# Patient Record
Sex: Female | Born: 1995 | Race: White | Hispanic: No | Marital: Single | Smoking: Never smoker
Health system: Southern US, Community
[De-identification: ages and names within clinical notes are randomized; demographics above are authoritative.]

## PROBLEM LIST (undated history)

## (undated) DIAGNOSIS — Z8619 Personal history of other infectious and parasitic diseases: Secondary | ICD-10-CM

## (undated) DIAGNOSIS — E039 Hypothyroidism, unspecified: Secondary | ICD-10-CM

## (undated) DIAGNOSIS — F419 Anxiety disorder, unspecified: Secondary | ICD-10-CM

## (undated) DIAGNOSIS — F909 Attention-deficit hyperactivity disorder, unspecified type: Secondary | ICD-10-CM

## (undated) DIAGNOSIS — E038 Other specified hypothyroidism: Secondary | ICD-10-CM

## (undated) DIAGNOSIS — D649 Anemia, unspecified: Secondary | ICD-10-CM

## (undated) HISTORY — DX: Other specified hypothyroidism: E03.8

## (undated) HISTORY — DX: Attention-deficit hyperactivity disorder, unspecified type: F90.9

## (undated) HISTORY — PX: NO PAST SURGERIES: SHX2092

## (undated) HISTORY — DX: Personal history of other infectious and parasitic diseases: Z86.19

---
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
## 2016-01-07 ENCOUNTER — Ambulatory Visit (HOSPITAL_COMMUNITY)
Admission: RE | Admit: 2016-01-07 | Discharge: 2016-01-07 | Disposition: A | Discharge status: Home / Self Care | Payer: BLUE CROSS/BLUE SHIELD | Source: Ambulatory Visit | Attending: Obstetrics and Gynecology | Admitting: Obstetrics and Gynecology

## 2016-01-07 ENCOUNTER — Other Ambulatory Visit (HOSPITAL_COMMUNITY): Payer: Self-pay | Admitting: Maternal and Fetal Medicine

## 2016-01-07 ENCOUNTER — Encounter (HOSPITAL_COMMUNITY): Payer: Self-pay

## 2016-01-07 DIAGNOSIS — Z3A33 33 weeks gestation of pregnancy: Secondary | ICD-10-CM | POA: Diagnosis not present

## 2016-01-07 DIAGNOSIS — O350XX0 Maternal care for (suspected) central nervous system malformation in fetus, not applicable or unspecified: Secondary | ICD-10-CM | POA: Diagnosis present

## 2016-01-07 DIAGNOSIS — Z36 Encounter for antenatal screening of mother: Secondary | ICD-10-CM | POA: Insufficient documentation

## 2016-01-07 DIAGNOSIS — O3509X0 Maternal care for (suspected) other central nervous system malformation or damage in fetus, not applicable or unspecified: Secondary | ICD-10-CM

## 2016-01-28 ENCOUNTER — Ambulatory Visit (HOSPITAL_COMMUNITY)
Admission: RE | Admit: 2016-01-28 | Discharge: 2016-01-28 | Disposition: A | Discharge status: Home / Self Care | Payer: BLUE CROSS/BLUE SHIELD | Source: Ambulatory Visit | Attending: Obstetrics and Gynecology | Admitting: Obstetrics and Gynecology

## 2016-01-28 ENCOUNTER — Other Ambulatory Visit (HOSPITAL_COMMUNITY): Payer: Self-pay | Admitting: Obstetrics and Gynecology

## 2016-01-28 ENCOUNTER — Encounter (HOSPITAL_COMMUNITY): Payer: Self-pay

## 2016-01-28 DIAGNOSIS — Z3A36 36 weeks gestation of pregnancy: Secondary | ICD-10-CM

## 2016-01-28 DIAGNOSIS — O3509X0 Maternal care for (suspected) other central nervous system malformation or damage in fetus, not applicable or unspecified: Secondary | ICD-10-CM

## 2016-01-28 DIAGNOSIS — O350XX0 Maternal care for (suspected) central nervous system malformation in fetus, not applicable or unspecified: Secondary | ICD-10-CM

## 2016-02-02 LAB — OB RESULTS CONSOLE GBS: STREP GROUP B AG: POSITIVE

## 2016-03-03 ENCOUNTER — Other Ambulatory Visit: Payer: Self-pay | Admitting: Obstetrics and Gynecology

## 2016-03-03 ENCOUNTER — Telehealth (HOSPITAL_COMMUNITY): Payer: Self-pay | Admitting: *Deleted

## 2016-03-03 ENCOUNTER — Encounter (HOSPITAL_COMMUNITY): Payer: Self-pay | Admitting: *Deleted

## 2016-03-03 NOTE — Telephone Encounter (Signed)
Preadmission screen  

## 2016-03-09 ENCOUNTER — Encounter (HOSPITAL_COMMUNITY): Payer: Self-pay

## 2016-03-09 ENCOUNTER — Inpatient Hospital Stay (HOSPITAL_COMMUNITY): Payer: BLUE CROSS/BLUE SHIELD | Admitting: Anesthesiology

## 2016-03-09 ENCOUNTER — Encounter (HOSPITAL_COMMUNITY)
Admission: RE | Disposition: A | Discharge status: Home / Self Care | Payer: Self-pay | Source: Ambulatory Visit | Attending: Obstetrics and Gynecology

## 2016-03-09 ENCOUNTER — Inpatient Hospital Stay (HOSPITAL_COMMUNITY)
Admission: RE | Admit: 2016-03-09 | Discharge: 2016-03-12 | DRG: 765 | Disposition: A | Discharge status: Home / Self Care | Payer: BLUE CROSS/BLUE SHIELD | Source: Ambulatory Visit | Attending: Obstetrics and Gynecology | Admitting: Obstetrics and Gynecology

## 2016-03-09 DIAGNOSIS — O3663X0 Maternal care for excessive fetal growth, third trimester, not applicable or unspecified: Secondary | ICD-10-CM | POA: Diagnosis present

## 2016-03-09 DIAGNOSIS — O99824 Streptococcus B carrier state complicating childbirth: Secondary | ICD-10-CM | POA: Diagnosis present

## 2016-03-09 DIAGNOSIS — O9081 Anemia of the puerperium: Secondary | ICD-10-CM | POA: Diagnosis not present

## 2016-03-09 DIAGNOSIS — Z3A42 42 weeks gestation of pregnancy: Secondary | ICD-10-CM | POA: Diagnosis not present

## 2016-03-09 DIAGNOSIS — O99214 Obesity complicating childbirth: Secondary | ICD-10-CM | POA: Diagnosis present

## 2016-03-09 DIAGNOSIS — Z8249 Family history of ischemic heart disease and other diseases of the circulatory system: Secondary | ICD-10-CM | POA: Diagnosis not present

## 2016-03-09 DIAGNOSIS — Z833 Family history of diabetes mellitus: Secondary | ICD-10-CM

## 2016-03-09 DIAGNOSIS — Z68.41 Body mass index (BMI) pediatric, 5th percentile to less than 85th percentile for age: Secondary | ICD-10-CM | POA: Diagnosis not present

## 2016-03-09 DIAGNOSIS — D62 Acute posthemorrhagic anemia: Secondary | ICD-10-CM | POA: Diagnosis not present

## 2016-03-09 DIAGNOSIS — O480 Post-term pregnancy: Secondary | ICD-10-CM | POA: Diagnosis present

## 2016-03-09 DIAGNOSIS — E669 Obesity, unspecified: Secondary | ICD-10-CM | POA: Diagnosis present

## 2016-03-09 LAB — RPR: RPR Ser Ql: NONREACTIVE

## 2016-03-09 LAB — CBC
HEMATOCRIT: 30.7 % — AB (ref 36.0–46.0)
HEMOGLOBIN: 10 g/dL — AB (ref 12.0–15.0)
MCH: 27.9 pg (ref 26.0–34.0)
MCHC: 32.6 g/dL (ref 30.0–36.0)
MCV: 85.8 fL (ref 78.0–100.0)
Platelets: 132 10*3/uL — ABNORMAL LOW (ref 150–400)
RBC: 3.58 MIL/uL — AB (ref 3.87–5.11)
RDW: 14.5 % (ref 11.5–15.5)
WBC: 8.9 10*3/uL (ref 4.0–10.5)

## 2016-03-09 LAB — TYPE AND SCREEN
ABO/RH(D): A POS
ANTIBODY SCREEN: NEGATIVE

## 2016-03-09 LAB — ABO/RH: ABO/RH(D): A POS

## 2016-03-09 SURGERY — Surgical Case
Anesthesia: Epidural

## 2016-03-09 MED ORDER — MEPERIDINE HCL 25 MG/ML IJ SOLN: 6.2500 mg | INTRAMUSCULAR | Status: DC | PRN

## 2016-03-09 MED ORDER — MORPHINE SULFATE-NACL 0.5-0.9 MG/ML-% IV SOSY
PREFILLED_SYRINGE | INTRAVENOUS | Status: AC
  Filled 2016-03-09: qty 6

## 2016-03-09 MED ORDER — SCOPOLAMINE 1 MG/3DAYS TD PT72
1.0000 | MEDICATED_PATCH | Freq: Once | TRANSDERMAL | Status: DC
  Filled 2016-03-09: qty 1

## 2016-03-09 MED ORDER — ACETAMINOPHEN 325 MG PO TABS: 650.0000 mg | ORAL_TABLET | ORAL | Status: DC | PRN

## 2016-03-09 MED ORDER — NALBUPHINE HCL 10 MG/ML IJ SOLN: 5.0000 mg | INTRAMUSCULAR | Status: DC | PRN

## 2016-03-09 MED ORDER — EPHEDRINE 5 MG/ML INJ: 10.0000 mg | INTRAVENOUS | Status: DC | PRN

## 2016-03-09 MED ORDER — OXYTOCIN 40 UNITS IN LACTATED RINGERS INFUSION - SIMPLE MED: 2.5000 [IU]/h | INTRAVENOUS | Status: AC

## 2016-03-09 MED ORDER — NALBUPHINE HCL 10 MG/ML IJ SOLN
5.0000 mg | INTRAMUSCULAR | Status: DC | PRN
  Administered 2016-03-09 – 2016-03-10 (×3): 5 mg via INTRAVENOUS
  Filled 2016-03-09 (×3): qty 1

## 2016-03-09 MED ORDER — LIDOCAINE-EPINEPHRINE (PF) 2 %-1:200000 IJ SOLN
INTRAMUSCULAR | Status: AC
  Filled 2016-03-09: qty 20

## 2016-03-09 MED ORDER — KETOROLAC TROMETHAMINE 30 MG/ML IJ SOLN
30.0000 mg | Freq: Four times a day (QID) | INTRAMUSCULAR | Status: AC | PRN
  Administered 2016-03-09: 30 mg via INTRAVENOUS

## 2016-03-09 MED ORDER — OXYTOCIN 10 UNIT/ML IJ SOLN
INTRAVENOUS | Status: DC | PRN
  Administered 2016-03-09: 40 [IU] via INTRAVENOUS

## 2016-03-09 MED ORDER — PHENYLEPHRINE 40 MCG/ML (10ML) SYRINGE FOR IV PUSH (FOR BLOOD PRESSURE SUPPORT)
PREFILLED_SYRINGE | INTRAVENOUS | Status: AC
  Filled 2016-03-09: qty 10

## 2016-03-09 MED ORDER — ONDANSETRON HCL 4 MG/2ML IJ SOLN
4.0000 mg | Freq: Four times a day (QID) | INTRAMUSCULAR | Status: DC | PRN
  Administered 2016-03-09 (×2): 4 mg via INTRAVENOUS
  Filled 2016-03-09 (×3): qty 2

## 2016-03-09 MED ORDER — MORPHINE SULFATE-NACL 0.5-0.9 MG/ML-% IV SOSY
PREFILLED_SYRINGE | INTRAVENOUS | Status: DC | PRN
  Administered 2016-03-09: 3 mg via EPIDURAL

## 2016-03-09 MED ORDER — ONDANSETRON HCL 4 MG/2ML IJ SOLN: 4.0000 mg | Freq: Three times a day (TID) | INTRAMUSCULAR | Status: DC | PRN

## 2016-03-09 MED ORDER — DIBUCAINE 1 % RE OINT: 1.0000 "application " | TOPICAL_OINTMENT | RECTAL | Status: DC | PRN

## 2016-03-09 MED ORDER — TERBUTALINE SULFATE 1 MG/ML IJ SOLN: 0.2500 mg | Freq: Once | INTRAMUSCULAR | Status: DC | PRN

## 2016-03-09 MED ORDER — SCOPOLAMINE 1 MG/3DAYS TD PT72SCOPOLAMINE 1 MG/3DAYS
MEDICATED_PATCH | TRANSDERMAL | Status: DC | PRN
Start: 2016-03-09 — End: 2016-03-09
  Administered 2016-03-09: 1 via TRANSDERMAL

## 2016-03-09 MED ORDER — SENNOSIDES-DOCUSATE SODIUM 8.6-50 MG PO TABS
2.0000 | ORAL_TABLET | ORAL | Status: DC
  Administered 2016-03-10 – 2016-03-11 (×3): 2 via ORAL
  Filled 2016-03-09 (×3): qty 2

## 2016-03-09 MED ORDER — ONDANSETRON HCL 4 MG/2ML IJ SOLN
INTRAMUSCULAR | Status: DC | PRN
  Administered 2016-03-09: 4 mg via INTRAVENOUS

## 2016-03-09 MED ORDER — ONDANSETRON HCL 4 MG/2ML IJ SOLN
INTRAMUSCULAR | Status: AC
  Filled 2016-03-09: qty 2

## 2016-03-09 MED ORDER — BUPIVACAINE HCL (PF) 0.25 % IJ SOLN
INTRAMUSCULAR | Status: AC
  Filled 2016-03-09: qty 30

## 2016-03-09 MED ORDER — DIPHENHYDRAMINE HCL 25 MG PO CAPS: 25.0000 mg | ORAL_CAPSULE | ORAL | Status: DC | PRN

## 2016-03-09 MED ORDER — DIPHENHYDRAMINE HCL 50 MG/ML IJ SOLN: 12.5000 mg | INTRAMUSCULAR | Status: DC | PRN

## 2016-03-09 MED ORDER — ACETAMINOPHEN 500 MG PO TABS
1000.0000 mg | ORAL_TABLET | Freq: Four times a day (QID) | ORAL | Status: AC
  Administered 2016-03-09 – 2016-03-10 (×3): 1000 mg via ORAL
  Filled 2016-03-09 (×3): qty 2

## 2016-03-09 MED ORDER — DEXAMETHASONE SODIUM PHOSPHATE 10 MG/ML IJ SOLN
INTRAMUSCULAR | Status: AC
  Filled 2016-03-09: qty 1

## 2016-03-09 MED ORDER — NALOXONE HCL 2 MG/2ML IJ SOSY
1.0000 ug/kg/h | PREFILLED_SYRINGE | INTRAVENOUS | Status: DC | PRN
  Filled 2016-03-09: qty 2

## 2016-03-09 MED ORDER — METOCLOPRAMIDE HCL 5 MG/ML IJ SOLN
INTRAMUSCULAR | Status: AC
  Filled 2016-03-09: qty 2

## 2016-03-09 MED ORDER — MENTHOL 3 MG MT LOZG: 1.0000 | LOZENGE | OROMUCOSAL | Status: DC | PRN

## 2016-03-09 MED ORDER — LACTATED RINGERS IV SOLN
INTRAVENOUS | Status: DC | PRN
  Administered 2016-03-09: 17:00:00 via INTRAVENOUS

## 2016-03-09 MED ORDER — SODIUM BICARBONATE 8.4 % IV SOLN
INTRAVENOUS | Status: DC | PRN
  Administered 2016-03-09 (×2): 5 mL via EPIDURAL

## 2016-03-09 MED ORDER — OXYCODONE-ACETAMINOPHEN 5-325 MG PO TABS: 1.0000 | ORAL_TABLET | ORAL | Status: DC | PRN

## 2016-03-09 MED ORDER — SODIUM CHLORIDE 0.9% FLUSH: 3.0000 mL | INTRAVENOUS | Status: DC | PRN

## 2016-03-09 MED ORDER — PRENATAL MULTIVITAMIN CH
1.0000 | ORAL_TABLET | Freq: Every day | ORAL | Status: DC
  Administered 2016-03-10 – 2016-03-11 (×2): 1 via ORAL
  Filled 2016-03-09 (×2): qty 1

## 2016-03-09 MED ORDER — LIDOCAINE HCL (PF) 1 % IJ SOLN
INTRAMUSCULAR | Status: DC | PRN
  Administered 2016-03-09 (×2): 4 mL

## 2016-03-09 MED ORDER — COCONUT OIL OIL
1.0000 "application " | TOPICAL_OIL | Status: DC | PRN
  Administered 2016-03-11: 1 via TOPICAL
  Filled 2016-03-09: qty 120

## 2016-03-09 MED ORDER — MISOPROSTOL 25 MCG QUARTER TABLET
25.0000 ug | ORAL_TABLET | ORAL | Status: DC | PRN
  Administered 2016-03-09: 25 ug via VAGINAL
  Filled 2016-03-09: qty 0.25
  Filled 2016-03-09: qty 1
  Filled 2016-03-09: qty 0.25

## 2016-03-09 MED ORDER — PENICILLIN G POTASSIUM 5000000 UNITS IJ SOLR
2.5000 10*6.[IU] | INTRAMUSCULAR | Status: DC
  Administered 2016-03-09 (×3): 2.5 10*6.[IU] via INTRAVENOUS
  Filled 2016-03-09 (×6): qty 2.5

## 2016-03-09 MED ORDER — OXYTOCIN 40 UNITS IN LACTATED RINGERS INFUSION - SIMPLE MED
2.5000 [IU]/h | INTRAVENOUS | Status: DC
  Filled 2016-03-09: qty 1000

## 2016-03-09 MED ORDER — OXYTOCIN BOLUS FROM INFUSION: 500.0000 mL | Freq: Once | INTRAVENOUS | Status: DC

## 2016-03-09 MED ORDER — CEFAZOLIN SODIUM-DEXTROSE 2-4 GM/100ML-% IV SOLN
INTRAVENOUS | Status: AC
  Filled 2016-03-09: qty 100

## 2016-03-09 MED ORDER — PHENYLEPHRINE HCL 10 MG/ML IJ SOLN
INTRAMUSCULAR | Status: DC | PRN
  Administered 2016-03-09 (×2): 80 ug via INTRAVENOUS
  Administered 2016-03-09: 40 ug via INTRAVENOUS
  Administered 2016-03-09: 80 ug via INTRAVENOUS

## 2016-03-09 MED ORDER — CEFAZOLIN SODIUM-DEXTROSE 2-3 GM-% IV SOLR
INTRAVENOUS | Status: DC | PRN
  Administered 2016-03-09: 2 g via INTRAVENOUS

## 2016-03-09 MED ORDER — LACTATED RINGERS IV SOLN
500.0000 mL | Freq: Once | INTRAVENOUS | Status: AC
  Administered 2016-03-09: 500 mL via INTRAVENOUS

## 2016-03-09 MED ORDER — SOD CITRATE-CITRIC ACID 500-334 MG/5ML PO SOLN
30.0000 mL | ORAL | Status: DC | PRN
  Administered 2016-03-09: 30 mL via ORAL
  Filled 2016-03-09: qty 15

## 2016-03-09 MED ORDER — FENTANYL CITRATE (PF) 100 MCG/2ML IJ SOLN
INTRAMUSCULAR | Status: AC
  Filled 2016-03-09: qty 2

## 2016-03-09 MED ORDER — LACTATED RINGERS IV SOLN
500.0000 mL | INTRAVENOUS | Status: DC | PRN
Start: 2016-03-09 — End: 2016-03-09

## 2016-03-09 MED ORDER — PHENYLEPHRINE 40 MCG/ML (10ML) SYRINGE FOR IV PUSH (FOR BLOOD PRESSURE SUPPORT)
80.0000 ug | PREFILLED_SYRINGE | INTRAVENOUS | Status: DC | PRN
  Filled 2016-03-09: qty 10

## 2016-03-09 MED ORDER — LACTATED RINGERS IV SOLN: INTRAVENOUS | Status: DC

## 2016-03-09 MED ORDER — OXYCODONE-ACETAMINOPHEN 5-325 MG PO TABS: 2.0000 | ORAL_TABLET | ORAL | Status: DC | PRN

## 2016-03-09 MED ORDER — BUPIVACAINE HCL (PF) 0.25 % IJ SOLN
INTRAMUSCULAR | Status: DC | PRN
  Administered 2016-03-09 (×2): 30 mL

## 2016-03-09 MED ORDER — ZOLPIDEM TARTRATE 5 MG PO TABS: 5.0000 mg | ORAL_TABLET | Freq: Every evening | ORAL | Status: DC | PRN

## 2016-03-09 MED ORDER — SIMETHICONE 80 MG PO CHEW
80.0000 mg | CHEWABLE_TABLET | Freq: Three times a day (TID) | ORAL | Status: DC
  Administered 2016-03-10 – 2016-03-12 (×6): 80 mg via ORAL
  Filled 2016-03-09 (×6): qty 1

## 2016-03-09 MED ORDER — ACETAMINOPHEN 325 MG PO TABS
650.0000 mg | ORAL_TABLET | ORAL | Status: DC | PRN
  Administered 2016-03-11: 650 mg via ORAL
  Filled 2016-03-09: qty 2

## 2016-03-09 MED ORDER — SIMETHICONE 80 MG PO CHEW
80.0000 mg | CHEWABLE_TABLET | ORAL | Status: DC
  Administered 2016-03-10 – 2016-03-11 (×3): 80 mg via ORAL
  Filled 2016-03-09 (×3): qty 1

## 2016-03-09 MED ORDER — IBUPROFEN 600 MG PO TABS
600.0000 mg | ORAL_TABLET | Freq: Four times a day (QID) | ORAL | Status: DC
  Administered 2016-03-10 – 2016-03-12 (×10): 600 mg via ORAL
  Filled 2016-03-09 (×10): qty 1

## 2016-03-09 MED ORDER — WITCH HAZEL-GLYCERIN EX PADS: 1.0000 "application " | MEDICATED_PAD | CUTANEOUS | Status: DC | PRN

## 2016-03-09 MED ORDER — DEXAMETHASONE SODIUM PHOSPHATE 10 MG/ML IJ SOLN
INTRAMUSCULAR | Status: DC | PRN
  Administered 2016-03-09: 10 mg via INTRAVENOUS

## 2016-03-09 MED ORDER — EPHEDRINE SULFATE-NACL 50-0.9 MG/10ML-% IV SOSY
PREFILLED_SYRINGE | INTRAVENOUS | Status: DC | PRN
  Administered 2016-03-09: 10 mg via INTRAVENOUS

## 2016-03-09 MED ORDER — METHYLERGONOVINE MALEATE 0.2 MG/ML IJ SOLN: 0.2000 mg | INTRAMUSCULAR | Status: DC | PRN

## 2016-03-09 MED ORDER — NALBUPHINE HCL 10 MG/ML IJ SOLN: 5.0000 mg | Freq: Once | INTRAMUSCULAR | Status: DC | PRN

## 2016-03-09 MED ORDER — SODIUM BICARBONATE 8.4 % IV SOLN
INTRAVENOUS | Status: AC
  Filled 2016-03-09: qty 50

## 2016-03-09 MED ORDER — ONDANSETRON HCL 4 MG/2ML IJ SOLN: 4.0000 mg | Freq: Once | INTRAMUSCULAR | Status: DC | PRN

## 2016-03-09 MED ORDER — LACTATED RINGERS IV SOLN
INTRAVENOUS | Status: DC | PRN
  Administered 2016-03-09 (×2): via INTRAVENOUS

## 2016-03-09 MED ORDER — DIPHENHYDRAMINE HCL 25 MG PO CAPS: 25.0000 mg | ORAL_CAPSULE | Freq: Four times a day (QID) | ORAL | Status: DC | PRN

## 2016-03-09 MED ORDER — PENICILLIN G POTASSIUM 5000000 UNITS IJ SOLR
5.0000 10*6.[IU] | Freq: Once | INTRAVENOUS | Status: AC
  Administered 2016-03-09: 5 10*6.[IU] via INTRAVENOUS
  Filled 2016-03-09: qty 5

## 2016-03-09 MED ORDER — SIMETHICONE 80 MG PO CHEW: 80.0000 mg | CHEWABLE_TABLET | ORAL | Status: DC | PRN

## 2016-03-09 MED ORDER — TETANUS-DIPHTH-ACELL PERTUSSIS 5-2.5-18.5 LF-MCG/0.5 IM SUSP
0.5000 mL | Freq: Once | INTRAMUSCULAR | Status: AC
  Administered 2016-03-12: 0.5 mL via INTRAMUSCULAR
  Filled 2016-03-09: qty 0.5

## 2016-03-09 MED ORDER — LACTATED RINGERS IV SOLN
INTRAVENOUS | Status: DC
  Administered 2016-03-09: 01:00:00 via INTRAVENOUS

## 2016-03-09 MED ORDER — SCOPOLAMINE 1 MG/3DAYS TD PT72
MEDICATED_PATCH | TRANSDERMAL | Status: AC
  Filled 2016-03-09: qty 1

## 2016-03-09 MED ORDER — EPHEDRINE SULFATE 50 MG/ML IJ SOLN
INTRAMUSCULAR | Status: AC
  Filled 2016-03-09: qty 1

## 2016-03-09 MED ORDER — PHENYLEPHRINE 40 MCG/ML (10ML) SYRINGE FOR IV PUSH (FOR BLOOD PRESSURE SUPPORT): 80.0000 ug | PREFILLED_SYRINGE | INTRAVENOUS | Status: DC | PRN

## 2016-03-09 MED ORDER — KETOROLAC TROMETHAMINE 30 MG/ML IJ SOLN
30.0000 mg | Freq: Four times a day (QID) | INTRAMUSCULAR | Status: AC | PRN
  Filled 2016-03-09: qty 1

## 2016-03-09 MED ORDER — FENTANYL CITRATE (PF) 100 MCG/2ML IJ SOLN
25.0000 ug | INTRAMUSCULAR | Status: DC | PRN
  Administered 2016-03-09 (×2): 25 ug via INTRAVENOUS

## 2016-03-09 MED ORDER — FLEET ENEMA 7-19 GM/118ML RE ENEM: 1.0000 | ENEMA | RECTAL | Status: DC | PRN

## 2016-03-09 MED ORDER — FENTANYL 2.5 MCG/ML BUPIVACAINE 1/10 % EPIDURAL INFUSION (WH - ANES)
14.0000 mL/h | INTRAMUSCULAR | Status: DC | PRN
  Administered 2016-03-09 (×2): 14 mL/h via EPIDURAL
  Filled 2016-03-09: qty 125

## 2016-03-09 MED ORDER — OXYTOCIN 40 UNITS IN LACTATED RINGERS INFUSION - SIMPLE MED
1.0000 m[IU]/min | INTRAVENOUS | Status: DC
  Administered 2016-03-09: 2 m[IU]/min via INTRAVENOUS

## 2016-03-09 MED ORDER — NALOXONE HCL 0.4 MG/ML IJ SOLN: 0.4000 mg | INTRAMUSCULAR | Status: DC | PRN

## 2016-03-09 MED ORDER — LIDOCAINE HCL (PF) 1 % IJ SOLN: 30.0000 mL | INTRAMUSCULAR | Status: DC | PRN

## 2016-03-09 MED ORDER — OXYTOCIN 10 UNIT/ML IJ SOLN
INTRAMUSCULAR | Status: AC
  Filled 2016-03-09: qty 4

## 2016-03-09 MED ORDER — METHYLERGONOVINE MALEATE 0.2 MG PO TABS: 0.2000 mg | ORAL_TABLET | ORAL | Status: DC | PRN

## 2016-03-09 SURGICAL SUPPLY — 41 items
CHLORAPREP W/TINT 26ML (MISCELLANEOUS) ×3 IMPLANT
CLAMP CORD UMBIL (MISCELLANEOUS) IMPLANT
CLOSURE WOUND 1/2 X4 (GAUZE/BANDAGES/DRESSINGS) ×1
CLOTH BEACON ORANGE TIMEOUT ST (SAFETY) ×3 IMPLANT
CONTAINER PREFILL 10% NBF 15ML (MISCELLANEOUS) IMPLANT
DRSG OPSITE POSTOP 4X10 (GAUZE/BANDAGES/DRESSINGS) ×3 IMPLANT
DRSG TELFA 3X8 NADH (GAUZE/BANDAGES/DRESSINGS) ×3 IMPLANT
ELECT REM PT RETURN 9FT ADLT (ELECTROSURGICAL) ×3
ELECTRODE REM PT RTRN 9FT ADLT (ELECTROSURGICAL) ×1 IMPLANT
EXTRACTOR VACUUM M CUP 4 TUBE (SUCTIONS) IMPLANT
EXTRACTOR VACUUM M CUP 4' TUBE (SUCTIONS)
GLOVE BIO SURGEON STRL SZ7.5 (GLOVE) ×3 IMPLANT
GLOVE BIOGEL PI IND STRL 7.0 (GLOVE) ×1 IMPLANT
GLOVE BIOGEL PI INDICATOR 7.0 (GLOVE) ×2
GOWN STRL REUS W/TWL LRG LVL3 (GOWN DISPOSABLE) ×6 IMPLANT
KIT ABG SYR 3ML LUER SLIP (SYRINGE) IMPLANT
NDL HYPO 25X5/8 SAFETYGLIDE (NEEDLE) IMPLANT
NDL SPNL 20GX3.5 QUINCKE YW (NEEDLE) IMPLANT
NEEDLE HYPO 22GX1.5 SAFETY (NEEDLE) ×3 IMPLANT
NEEDLE HYPO 25X5/8 SAFETYGLIDE (NEEDLE) IMPLANT
NEEDLE SPNL 20GX3.5 QUINCKE YW (NEEDLE) IMPLANT
NS IRRIG 1000ML POUR BTL (IV SOLUTION) ×3 IMPLANT
PACK C SECTION WH (CUSTOM PROCEDURE TRAY) ×3 IMPLANT
PAD DRESSING TELFA 3X8 NADH (GAUZE/BANDAGES/DRESSINGS) IMPLANT
PENCIL SMOKE EVAC W/HOLSTER (ELECTROSURGICAL) ×3 IMPLANT
SPONGE LAP 18X18 X RAY DECT (DISPOSABLE) ×2 IMPLANT
STRIP CLOSURE SKIN 1/2X4 (GAUZE/BANDAGES/DRESSINGS) ×1 IMPLANT
SUT MNCRL 0 VIOLET CTX 36 (SUTURE) ×2 IMPLANT
SUT MNCRL AB 3-0 PS2 27 (SUTURE) IMPLANT
SUT MON AB 2-0 CT1 27 (SUTURE) ×3 IMPLANT
SUT MON AB-0 CT1 36 (SUTURE) ×6 IMPLANT
SUT MONOCRYL 0 CTX 36 (SUTURE) ×4
SUT PLAIN 0 NONE (SUTURE) IMPLANT
SUT PLAIN 2 0 (SUTURE)
SUT PLAIN 2 0 XLH (SUTURE) ×2 IMPLANT
SUT PLAIN ABS 2-0 CT1 27XMFL (SUTURE) IMPLANT
SYR 20CC LL (SYRINGE) IMPLANT
SYR CONTROL 10ML LL (SYRINGE) ×3 IMPLANT
TAPE STRIPS DRAPE STRL (GAUZE/BANDAGES/DRESSINGS) ×2 IMPLANT
TOWEL OR 17X24 6PK STRL BLUE (TOWEL DISPOSABLE) ×3 IMPLANT
TRAY FOLEY CATH SILVER 14FR (SET/KITS/TRAYS/PACK) ×3 IMPLANT

## 2016-03-09 NOTE — Anesthesia Pain Management Evaluation Note (Signed)
  CRNA Pain Management Visit Note  Patient: Taylor PullingMaggie E Goth, 20 y.o., female  "Hello I am a member of the anesthesia team at [facility]. We have an anesthesia team available at all times to provide care throughout the hospital, including epidural management and anesthesia for C-section. I don't know your plan for the delivery whether it a natural birth, water birth, IV sedation, nitrous supplementation, doula or epidural, but we want to meet your pain goals."   1.Was your pain managed to your expectations on prior hospitalizations?   No prior hospitalizations  2.What is your expectation for pain management during this hospitalization?     Epidural  3.How can we help you reach that goal? Epidural when labor is established.  Record the patient's initial score and the patient's pain goal.   Pain: 4  Pain Goal: 8 [facility] wants you to be able to say your pain was always managed very well.  Aprill Banko 03/09/2016

## 2016-03-09 NOTE — Op Note (Signed)
Cesarean Section Procedure Note  Indications: failure to progress: arrest of dilation and macrosomia  Pre-operative Diagnosis: 42 week 1 day pregnancy.  Post-operative Diagnosis: same  Surgeon: Lenoard AdenAAVON,Andrik Sandt J   Assistants: RNFA  Anesthesia: Epidural anesthesia and Local anesthesia 0.25.% bupivacaine  ASA Class: 2  Procedure Details  The patient was seen in the Holding Room. The risks, benefits, complications, treatment options, and expected outcomes were discussed with the patient.  The patient concurred with the proposed plan, giving informed consent. The risks of anesthesia, infection, bleeding and possible injury to other organs discussed. Injury to bowel, bladder, or ureter with possible need for repair discussed. Possible need for transfusion with secondary risks of hepatitis or HIV acquisition discussed. Post operative complications to include but not limited to DVT, PE and Pneumonia noted. The site of surgery properly noted/marked. The patient was taken to Operating Room # 9, identified as Taylor Rios and the procedure verified as C-Section Delivery. A Time Out was held and the above information confirmed.  After induction of anesthesia, the patient was draped and prepped in the usual sterile manner. A Pfannenstiel incision was made and carried down through the subcutaneous tissue to the fascia. Fascial incision was made and extended transversely using Mayo scissors. The fascia was separated from the underlying rectus tissue superiorly and inferiorly. The peritoneum was identified and entered. Peritoneal incision was extended longitudinally. The utero-vesical peritoneal reflection was incised transversely and the bladder flap was bluntly freed from the lower uterine segment. A low transverse uterine incision(Kerr hysterotomy) was made. Delivered from OT presentation was a  female with Apgar scores of 8 at one minute and 9 at five minutes. Bulb suctioning gently performed. Neonatal  team in attendance.After the umbilical cord was clamped and cut cord blood was obtained for evaluation. The placenta was removed intact and appeared normal. The uterus was curetted with a dry lap pack. Good hemostasis was noted.The uterine outline, tubes and ovaries appeared normal. The uterine incision was closed with running locked sutures of 0 Monocryl x 2 layers. Hemostasis was observed. Lavage was carried out until clear.The parietal peritoneum was closed with a running 2-0 Monocryl suture. The fascia was then reapproximated with running sutures of 0 Monocryl. The skin was reapproximated with 4-0 vicryl after [place] closure with 2-0  Plain.  Instrument, sponge, and needle counts were correct prior the abdominal closure and at the conclusion of the case.   Findings: FLTM, OT, anterior placenta  Estimated Blood Loss:  800         Drains: foley                 Specimens: placenta                 Complications:  None; patient tolerated the procedure well.         Disposition: PACU - hemodynamically stable.         Condition: stable  Attending Attestation: I performed the procedure.

## 2016-03-09 NOTE — Anesthesia Preprocedure Evaluation (Addendum)
Anesthesia Evaluation  Patient identified by MRN, date of birth, ID band Patient awake    Reviewed: Allergy & Precautions, NPO status , Patient's Chart, lab work & pertinent test results  Airway Mallampati: II  TM Distance: >3 FB Neck ROM: Full    Dental no notable dental hx. (+) Dental Advisory Given   Pulmonary neg pulmonary ROS,    Pulmonary exam normal breath sounds clear to auscultation       Cardiovascular negative cardio ROS Normal cardiovascular exam Rhythm:Regular Rate:Normal     Neuro/Psych negative neurological ROS  negative psych ROS   GI/Hepatic negative GI ROS, Neg liver ROS,   Endo/Other  obesity  Renal/GU negative Renal ROS  negative genitourinary   Musculoskeletal negative musculoskeletal ROS (+)   Abdominal   Peds negative pediatric ROS (+)  Hematology negative hematology ROS (+)   Anesthesia Other Findings   Reproductive/Obstetrics (+) Pregnancy                             Anesthesia Physical Anesthesia Plan  ASA: II  Anesthesia Plan: Epidural   Post-op Pain Management:    Induction:   Airway Management Planned:   Additional Equipment:   Intra-op Plan:   Post-operative Plan:   Informed Consent: I have reviewed the patients History and Physical, chart, labs and discussed the procedure including the risks, benefits and alternatives for the proposed anesthesia with the patient or authorized representative who has indicated his/her understanding and acceptance.   Dental advisory given  Plan Discussed with: CRNA  Anesthesia Plan Comments:         Anesthesia Quick Evaluation

## 2016-03-09 NOTE — Anesthesia Procedure Notes (Signed)
Epidural Patient location during procedure: OB  Staffing Anesthesiologist: Briget Shaheed Performed: anesthesiologist   Preanesthetic Checklist Completed: patient identified, site marked, surgical consent, pre-op evaluation, timeout performed, IV checked, risks and benefits discussed and monitors and equipment checked  Epidural Patient position: sitting Prep: site prepped and draped and DuraPrep Patient monitoring: continuous pulse ox and blood pressure Approach: midline Location: L3-L4 Injection technique: LOR saline  Needle:  Needle type: Tuohy  Needle gauge: 17 G Needle length: 9 cm and 9 Needle insertion depth: 7 cm Catheter type: closed end flexible Catheter size: 19 Gauge Catheter at skin depth: 12 cm Test dose: negative  Assessment Events: blood not aspirated, injection not painful, no injection resistance, negative IV test and no paresthesia  Additional Notes Patient identified. Risks/Benefits/Options discussed with patient including but not limited to bleeding, infection, nerve damage, paralysis, failed block, incomplete pain control, headache, blood pressure changes, nausea, vomiting, reactions to medication both or allergic, itching and postpartum back pain. Confirmed with bedside nurse the patient's most recent platelet count. Confirmed with patient that they are not currently taking any anticoagulation, have any bleeding history or any family history of bleeding disorders. Patient expressed understanding and wished to proceed. All questions were answered. Sterile technique was used throughout the entire procedure. Please see nursing notes for vital signs. Test dose was given through epidural catheter and negative prior to continuing to dose epidural or start infusion. Warning signs of high block given to the patient including shortness of breath, tingling/numbness in hands, complete motor block, or any concerning symptoms with instructions to call for help. Patient was given  instructions on fall risk and not to get out of bed. All questions and concerns addressed with instructions to call with any issues or inadequate analgesia.        

## 2016-03-09 NOTE — Transfer of Care (Signed)
Immediate Anesthesia Transfer of Care Note  Patient: Taylor Rios  Procedure(s) Performed: Procedure(s): CESAREAN SECTION (N/A)  Patient Location: PACU  Anesthesia Type:Epidural  Level of Consciousness: awake, alert , oriented and patient cooperative  Airway & Oxygen Therapy: Patient Spontanous Breathing  Post-op Assessment: Report given to RN and Post -op Vital signs reviewed and stable  Post vital signs: Reviewed and stable  Last Vitals:  Vitals:   03/09/16 1600 03/09/16 1630  BP: 119/69 124/85  Pulse: 78 79  Resp:    Temp:      Last Pain:  Vitals:   03/09/16 1505  TempSrc: Oral  PainSc:          Complications: No apparent anesthesia complications

## 2016-03-09 NOTE — Progress Notes (Signed)
Taylor Rios is a 20 y.o. G1P0 at 3965w0d by LMP admitted for induction of labor due to Post dates. Due date 9/12.  Subjective: Comfortable    Objective: BP 124/85   Pulse 79   Temp 97.6 F (36.4 C) (Oral)   Resp 16   Ht 5\' 7"  (1.702 m)   Wt 96.6 kg (213 lb)   LMP 05/14/2015   BMI 33.36 kg/m  No intake/output data recorded. Total I/O In: -  Out: 450 [Urine:450]  FHT:  FHR: 145 bpm, variability: moderate,  accelerations:  Present,  decelerations:  Absent UC:   regular, every 2 minutes SVE:   Dilation: 4 Effacement (%): 60 Station: -3 Exam by:: Dr. Billy Coastaavon  MVU > 250 x  2+ hours  Labs: Lab Results  Component Value Date   WBC 8.9 03/09/2016   HGB 10.0 (L) 03/09/2016   HCT 30.7 (L) 03/09/2016   MCV 85.8 03/09/2016   PLT 132 (L) 03/09/2016    Assessment / Plan: Arrest in active phase of labor  Labor: no progress, poorly applied vtx Preeclampsia:  no signs or symptoms of toxicity Fetal Wellbeing:  Category I Pain Control:  Epidural I/D:  n/a Anticipated MOD:  Proceed with csction. Risks vs benefits discussed. Consent done.  Taylor Rios J 03/09/2016, 4:35 PM

## 2016-03-09 NOTE — H&P (Addendum)
Taylor Rios is a 20 y.o. female presenting for postdates IOL. OB History    Gravida Para Term Preterm AB Living   1             SAB TAB Ectopic Multiple Live Births                 Past Medical History:  Diagnosis Date  . ADHD (attention deficit hyperactivity disorder)   . Hx of chlamydia infection   . Hx of gonorrhea    Past Surgical History:  Procedure Laterality Date  . NO PAST SURGERIES     Family History: family history includes Asthma in her maternal grandmother; Cancer in her father; Diabetes in her maternal aunt and maternal grandmother; Hypertension in her mother; Miscarriages / Stillbirths in her maternal grandmother. Social History:  reports that she has never smoked. She has never used smokeless tobacco. She reports that she does not drink alcohol or use drugs.     Maternal Diabetes: No Genetic Screening: Declined Maternal Ultrasounds/Referrals: Normal Fetal Ultrasounds or other Referrals:  None Maternal Substance Abuse:  No Significant Maternal Medications:  None Significant Maternal Lab Results:  None Other Comments:  None  Review of Systems  Constitutional: Negative.   All other systems reviewed and are negative.  Maternal Medical History:  Fetal activity: Perceived fetal activity is normal.   Last perceived fetal movement was within the past hour.    Prenatal complications: no prenatal complications Prenatal Complications - Diabetes: none.    Dilation: Closed Effacement (%): Thick Station: Ballotable Exam by:: Clement SayresK. Larrabee RN Blood pressure (!) 98/51, pulse 81, temperature 98.5 F (36.9 C), temperature source Oral, resp. rate 18, height 5\' 7"  (1.702 m), weight 96.6 kg (213 lb), last menstrual period 05/14/2015. Maternal Exam:  Uterine Assessment: Contraction strength is mild.  Contraction frequency is rare.   Abdomen: Patient reports no abdominal tenderness. Fetal presentation: vertex  Introitus: Normal vulva. Normal vagina.  Ferning test:  not done.  Nitrazine test: not done. Amniotic fluid character: not assessed.  Pelvis: questionable for delivery.   Cervix: Cervix evaluated by digital exam.     Physical Exam  Constitutional: She is oriented to person, place, and time. She appears well-developed and well-nourished.  HENT:  Head: Normocephalic and atraumatic.  Neck: Normal range of motion. Neck supple.  Cardiovascular: Normal rate and regular rhythm.   Respiratory: Effort normal and breath sounds normal.  GI: Soft. Bowel sounds are normal.  Genitourinary: Vagina normal and uterus normal.  Musculoskeletal: Normal range of motion.  Neurological: She is alert and oriented to person, place, and time. She has normal reflexes.  Skin: Skin is warm and dry.  Psychiatric: She has a normal mood and affect.    Prenatal labs: ABO, Rh: --/--/A POS, A POS (09/27 0120) Antibody: NEG (09/27 0120) Rubella: Immune (05/24 0000) RPR: Nonreactive (05/24 0000)  HBsAg: Negative (05/24 0000)  HIV: Non-reactive (05/24 0000)  GBS: Positive (08/22 0000)   Assessment/Plan: 42 wk IUP Late registrant for OB care Borderline fetal ventriculomegaly Will need neonatal sono IOL   Taylor Rios J 03/09/2016, 6:23 AM

## 2016-03-09 NOTE — Lactation Note (Signed)
This note was copied from a baby's chart. Lactation Consultation Note Initial visit at 6 hours of age.  Mom reports a few good feedings and one in the past hours.  Baby is asleep swaddled in crib.  LC offered at assist with latch.  Mom noted to have wide space breast with semi tubular appearance.  Mom reports regular menstrual cycles and breast changes during pregnancy.  Mom has short small nipple and large areolas. Lc assisted with hand expression with a few drops expressed and mom was able to return demonstration.  Drops of colostrum applied to lips and baby remains asleep and STS on mom.  [facility] resources given and discussed.  Encouraged to feed with early cues on demand.  Early newborn behavior discussed.  Mom to call for assist as needed.     Patient Name: Boy Delphina CahillMaggie Azam ZOXWR'UToday's Date: 03/09/2016 Reason for consult: Initial assessment   Maternal Data Has patient been taught Hand Expression?: Yes Does the patient have breastfeeding experience prior to this delivery?: No  Feeding Feeding Type: Breast Fed Length of feed: 20 min  LATCH Score/Interventions Latch: Too sleepy or reluctant, no latch achieved, no sucking elicited. Intervention(s): Skin to skin;Teach feeding cues;Waking techniques Intervention(s): Breast massage;Breast compression  Audible Swallowing: None Intervention(s): Skin to skin;Hand expression  Type of Nipple: Flat (short everted nipples)  Comfort (Breast/Nipple): Soft / non-tender     Hold (Positioning): Assistance needed to correctly position infant at breast and maintain latch. Intervention(s): Breastfeeding basics reviewed;Support Pillows;Position options;Skin to skin  LATCH Score: 4  Lactation Tools Discussed/Used     Consult Status Consult Status: Follow-up Date: 03/10/16 Follow-up type: In-patient    Katlynn Naser, Arvella MerlesJana Lynn 03/09/2016, 11:16 PM

## 2016-03-10 DIAGNOSIS — D62 Acute posthemorrhagic anemia: Secondary | ICD-10-CM | POA: Diagnosis not present

## 2016-03-10 LAB — CBC
HCT: 25 % — ABNORMAL LOW (ref 36.0–46.0)
HEMOGLOBIN: 8.6 g/dL — AB (ref 12.0–15.0)
MCH: 28.6 pg (ref 26.0–34.0)
MCHC: 34.4 g/dL (ref 30.0–36.0)
MCV: 83.1 fL (ref 78.0–100.0)
Platelets: 151 10*3/uL (ref 150–400)
RBC: 3.01 MIL/uL — ABNORMAL LOW (ref 3.87–5.11)
RDW: 14.2 % (ref 11.5–15.5)
WBC: 15.9 10*3/uL — ABNORMAL HIGH (ref 4.0–10.5)

## 2016-03-10 MED ORDER — POLYSACCHARIDE IRON COMPLEX 150 MG PO CAPS
150.0000 mg | ORAL_CAPSULE | Freq: Two times a day (BID) | ORAL | Status: DC
  Administered 2016-03-10 – 2016-03-12 (×5): 150 mg via ORAL
  Filled 2016-03-10 (×5): qty 1

## 2016-03-10 MED ORDER — ONDANSETRON 8 MG PO TBDP
8.0000 mg | ORAL_TABLET | Freq: Two times a day (BID) | ORAL | Status: DC
  Administered 2016-03-10: 8 mg via ORAL
  Filled 2016-03-10 (×6): qty 1

## 2016-03-10 NOTE — Anesthesia Postprocedure Evaluation (Signed)
Anesthesia Post Note  Patient: Taylor Rios  Procedure(s) Performed: Procedure(s) (LRB): CESAREAN SECTION (N/A)  Patient location during evaluation: Mother Baby Anesthesia Type: Epidural Level of consciousness: awake Pain management: pain level controlled Vital Signs Assessment: post-procedure vital signs reviewed and stable Respiratory status: spontaneous breathing Cardiovascular status: stable Postop Assessment: no headache, no backache, epidural receding and patient able to bend at knees Anesthetic complications: no     Last Vitals:  Vitals:   03/10/16 0232 03/10/16 0525  BP: (!) 110/48 (!) 117/58  Pulse: 71 88  Resp: 14 16  Temp: 36.7 C 36.7 C    Last Pain:  Vitals:   03/10/16 0525  TempSrc: Oral  PainSc: 0-No pain   Pain Goal:                 Edison PaceWILKERSON,Glorimar Stroope

## 2016-03-10 NOTE — Progress Notes (Signed)
Subjective: POD# 1 Information for the patient's newborn:  Shirleen SchirmerMcMillan, Boy Naziyah [[id number]][[id number]]  female   circ planned outpatient [facility](medicaid) Baby name: Boris LownKayden  Reports feeling well.  Feeding: breast Patient reports tolerating PO.  Breast symptoms: none Pain controlled with PO meds Denies HA/SOB/C/P/N/V/dizziness. Flatus absent. She reports vaginal bleeding as normal, without clots.  She is ambulating, urinating without difficulty.     Objective:   VS:    Vitals:   03/09/16 2115 03/09/16 2241 03/10/16 0232 03/10/16 0525  BP: 123/82 115/68 (!) 110/48 (!) 117/58  Pulse: 66 66 71 88  Resp: 20 18 14 16   Temp: 97.7 F (36.5 C) 97.6 F (36.4 C) 98 F (36.7 C) 98 F (36.7 C)  TempSrc: Oral Oral Oral Oral  SpO2:      Weight:      Height:         Intake/Output Summary (Last 24 hours) at 03/10/16 0836 Last data filed at 03/10/16 0254  Gross per 24 hour  Intake             1900 ml  Output             5220 ml  Net            -3320 ml        Recent Labs  03/09/16 0120 03/10/16 0555  WBC 8.9 15.9*  HGB 10.0* 8.6*  HCT 30.7* 25.0*  PLT 132* 151     Blood type: --/--/A POS, A POS (09/27 0120)  Rubella: Immune (05/24 0000)     Physical Exam:  General: alert, cooperative and no distress CV: Regular rate and rhythm Resp: clear Abdomen: soft, nontender, normal bowel sounds Incision: clean, dry, intact and dressing removed in shower today by accidents, replaced now. Uterine Fundus: firm, below umbilicus, nontender Lochia: minimal Ext: edema pedal +1 and Homans sign is negative, no sign of DVT      Assessment/Plan: 20 y.o.   POD# 1. G1P1001                  Principal Problem:   Postpartum care following cesarean delivery (9/27) Active Problems:   Post term pregnancy over 40 weeks   Acute blood loss anemia   Doing well, stable.     Start oral Fe, continue 4-6 wks PP         Advance diet as tolerated Encourage rest when baby rests Breastfeeding support Encourage  to ambulate Routine post-op care [facility] reviewed.   Neta Mendsaniela C Paul, CNM, MSN 03/10/2016, 8:36 AM

## 2016-03-10 NOTE — Progress Notes (Signed)
Taylor Rios,CNM notified that pt was nauseated.  Orders received

## 2016-03-11 NOTE — Progress Notes (Signed)
Patient ID: Taylor Rios, female   DOB: [birth year], 20 y.o.   MRN: [id number] Subjective: S/P Primary Cesarean Delivery for Arrest of Dilation / Macrosomia POD# 2 Information for the patient's newborn:  Shirleen SchirmerMcMillan, Boy Vinie [[id number]][[id number]]  female  / circ NOT planning  Reports feeling well. Desires an early d/c home later today. Feeding: breast Patient reports tolerating PO.  Breast symptoms: none Pain controlled with ibuprofen (OTC) and narcotic analgesics including Percocet Denies HA/SOB/C/P/N/V/dizziness. Flatus present. No BM. She reports vaginal bleeding as normal, without clots.  She is ambulating, urinating without difficult.     Objective:   VS:  Vitals:   03/10/16 1630 03/10/16 1808 03/10/16 2224 03/11/16 0535  BP: 125/60 (!) 122/59 120/71 (!) 109/50  Pulse: 76 93 91 68  Resp: 18 18 18 18   Temp: 98.2 F (36.8 C) 98.2 F (36.8 C) 98.5 F (36.9 C) 97.8 F (36.6 C)  TempSrc: Oral Oral    SpO2: 99%     Weight:      Height:         Intake/Output Summary (Last 24 hours) at 03/11/16 1031 Last data filed at 03/10/16 2000  Gross per 24 hour  Intake                0 ml  Output             1400 ml  Net            -1400 ml        Recent Labs  03/09/16 0120 03/10/16 0555  WBC 8.9 15.9*  HGB 10.0* 8.6*  HCT 30.7* 25.0*  PLT 132* 151     Blood type: --/--/A POS, A POS (09/27 0120)  Rubella: Immune (05/24 0000)     Physical Exam:   General: alert, cooperative, fatigued and no distress  CV: Regular rate and rhythm, S1S2 present or without murmur or extra heart sounds  Resp: clear  Abdomen: soft, nontender, normal bowel sounds  Incision: clean, dry, intact and skin well-approximated with sutures  Uterine Fundus: firm, 1 FB below umbilicus, nontender  Lochia: minimal  Ext: extremities normal, atraumatic, no cyanosis or edema, Homans sign is negative, no sign of DVT and no edema, redness or tenderness in the calves or thighs   Assessment/Plan: 20 y.o.   POD# 2.   S/P Cesarean Delivery.  Indications: Arrest of Dilation / Macrosomia                Principal Problem:   Postpartum care following cesarean delivery (9/27) Active Problems:   Post term pregnancy over 40 weeks   Acute blood loss anemia  Doing well, stable.               Regular diet as tolerated Ambulate Routine post-op care Early d/c home today pending peds approval  Kenard GowerAWSON, Latrese Carolan, M, MSN, CNM 03/11/2016, 10:31 AM

## 2016-03-11 NOTE — Lactation Note (Signed)
This note was copied from a baby's chart. Lactation Consultation Note  Patient Name: Taylor Rios ZOXWR'UToday's Date: 03/11/2016 Reason for consult: FDelphina Cahillollow-up assessment  Baby is nursing very well. Mom was taught signs/sound of swallowing. Swallowing is both visible and audible to the naked ear. Mom has no breast/nipple complaints.   During my consult, I did not observe wide-spaced breasts. Mom reports an increase in veining on breasts antentally.   Lurline HareRichey, Taylor Rios [facility] 03/11/2016, 10:57 AM

## 2016-03-12 MED ORDER — IBUPROFEN 600 MG PO TABS: 600.0000 mg | ORAL_TABLET | Freq: Four times a day (QID) | ORAL | 0 refills | Status: DC

## 2016-03-12 MED ORDER — OXYCODONE-ACETAMINOPHEN 5-325 MG PO TABS: 1.0000 | ORAL_TABLET | ORAL | 0 refills | Status: DC | PRN

## 2016-03-12 MED ORDER — POLYSACCHARIDE IRON COMPLEX 150 MG PO CAPS: 150.0000 mg | ORAL_CAPSULE | Freq: Two times a day (BID) | ORAL | 3 refills | Status: DC

## 2016-03-12 MED ORDER — MAGNESIUM OXIDE -MG SUPPLEMENT 400 (240 MG) MG PO TABS: 400.0000 mg | ORAL_TABLET | Freq: Every day | ORAL | 3 refills | Status: DC

## 2016-03-12 NOTE — Clinical Social Work Maternal (Signed)
  CLINICAL SOCIAL WORK MATERNAL/CHILD NOTE  Patient Details  Name: Taylor Rios MRN: [id number] Date of Birth: [birth year]  Date:  03/12/2016  Clinical Social Worker Initiating Note:  Ferdinand Lango Cortney Mckinney, MSW, LCSW-A Date/ Time Initiated:  03/12/16/1129     Child's Name:  Taylor Rios    Legal Guardian:  Mother   Need for Interpreter:  None   Date of Referral:  03/11/16     Reason for Referral:  Other (Comment) (Social Concerns regarding MOB and FOB relationship )   Referral Source:  RN   Address:  [address] [place], [place] [address]  Phone number:  [phone]   Household Members:  Self, Parents   Natural Supports (not living in the home):  Immediate Family, Parent   Professional Supports: None   Employment: Part-time   Type of Work: Educational psychologist    Education:  Database administrator Resources:      Other Resources:      Cultural/Religious Considerations Which May Impact Care:  None reported.  Strengths:  Ability to meet basic needs    Risk Factors/Current Problems:  Family/Relationship Issues    Cognitive State:  Alert , Insightful    Mood/Affect:  Interested , Calm , Comfortable    CSW Assessment: CSW met with MOB at bedside. This Probation officer explained role and reasoning for visit being because c/o social issues concerning FOB involvement with the baby. At this time, MOB noted to this writer that she and the FOB are not currently together and he is threatening to take her to court for custody of the child. MOB further notes to this writer that the FOB is 19years old and has not been involved with her the entire pregnancy and has just decided to show up requested access to the child. MOB verbalized questions regarding what her rights are as a mother to her child and suggestions as to what she should do. MOB further questioned how to seek medicaid for the baby.   This Probation officer expressed empathy for MOB and the situation she is currently in. This Probation officer  informed MOB that it is her complete decision to decide who is involved in her child's life. This Probation officer further noted to MOB that as the baby's mother she does get to make all decisions concerning the child and it is up to her and the FOB to decided together or separately, how visitation will occur moving forward.  This writer expressed to MOB if she has a concern for safety, she can contact law enforcement and seek out the proper avenues to ensure her and the babys continued safety.   This Probation officer provided MOB with resources on how to apply for medicaid. MOB verbalized understanding. This Probation officer assessed MOB preparation for baby to go home. MOB notes she has everything she needs to include a car seat and crib/basinett for baby. MOB requested to have contact information for CSW should she have any other questions. This Probation officer provided contact information. At this time, no other needs were addressed or requested. Case closed to this CSW.   CSW Plan/Description:  Information/Referral to [employer], MSW, [facility]  Office: [phone]

## 2016-03-12 NOTE — Progress Notes (Signed)
Patient ID: Taylor Rios, female   DOB: 07-17-1995, 20 y.o.   MRN: [id number] Subjective: S/P Primary Cesarean Delivery for Arrest of Dilation / Macrosomia POD# 3 Information for the patient's newborn:  Shirleen SchirmerMcMillan, Boy Kimm [[id number]][[id number]]  female  / circ planning outpatient   Reports feeling well.  Feeding: breast Patient reports tolerating PO.  Breast symptoms: none Pain controlled with ibuprofen (OTC) and narcotic analgesics including Percocet Denies HA/SOB/C/P/N/V/dizziness. Flatus present. (+) BM. She reports vaginal bleeding as normal, without clots.  She is ambulating, urinating without difficult.     Objective:   VS:  Vitals:   03/10/16 2224 03/11/16 0535 03/11/16 1833 03/12/16 0519  BP: 120/71 (!) 109/50 112/61 112/73  Pulse: 91 68 70 76  Resp: 18 18 18 18   Temp: 98.5 F (36.9 C) 97.8 F (36.6 C) 98.1 F (36.7 C) 98 F (36.7 C)  TempSrc:   Oral Oral  SpO2:      Weight:      Height:        No intake or output data in the 24 hours ending 03/12/16 0900       Recent Labs  03/10/16 0555  WBC 15.9*  HGB 8.6*  HCT 25.0*  PLT 151     Blood type: A POS (09/27 0120)  Rubella: Immune (05/24 0000)     Physical Exam:   General: alert, cooperative, fatigued and no distress  CV: Regular rate and rhythm, S1S2 present or without murmur or extra heart sounds  Resp: clear  Abdomen: soft, nontender, normal bowel sounds  Incision: clean, dry, intact and skin well-approximated with sutures  Uterine Fundus: firm, 2 FB below umbilicus, nontender  Lochia: minimal  Ext: extremities normal, atraumatic, no cyanosis or edema, Homans sign is negative, no sign of DVT and no edema, redness or tenderness in the calves or thighs   Assessment/Plan: 20 y.o.   POD# 3.  S/P Cesarean Delivery.  Indications: Arrest of Dilation / Macrosomia                Principal Problem:   Postpartum care following cesarean delivery (9/27) Active Problems:   Post term pregnancy over 40 weeks   Acute  blood loss anemia  Doing well, stable.               Regular diet as tolerated Ambulate Routine post-op care D/C home today  Kenard GowerDAWSON, Vikkie Goeden, M, MSN, CNM 03/12/2016, 9:00 AM

## 2016-03-12 NOTE — Discharge Summary (Signed)
OB Discharge Summary     Patient Name: Taylor Rios DOB: Nov 16, 1995 MRN: [id number]  Date of admission: 03/09/2016 Delivering MD: Olivia Mackie   Date of discharge: 03/12/2016  Admitting diagnosis: INDUCTION Intrauterine pregnancy: [redacted]w[redacted]d     Secondary diagnosis:  Principal Problem:   Postpartum care following cesarean delivery (9/27) Active Problems:   Post term pregnancy over 40 weeks   Acute blood loss anemia  Additional problems: none     Discharge diagnosis: Postterm Pregnancy                                                                                                Post partum procedures:none  Augmentation: AROM, Pitocin and Cytotec  Complications: None  Hospital course:  Induction of Labor With Cesarean Section  20 y.o. yo G1P1001 at [redacted]w[redacted]d was admitted to the hospital 03/09/2016 for induction of labor. Patient had a labor course significant for arrest of dilation at 4 cm. The patient went for cesarean section due to Macrosomia and Arrest of Dilation, and delivered a Viable infant, Membrane Rupture Time/Date: )1:10 PM ,03/09/2016  Details of operation can be found in separate operative Note.  Patient had an uncomplicated postpartum course. She is ambulating, tolerating a regular diet, passing flatus, and urinating well.  Patient is discharged home in stable condition on 03/14/16.                                     Physical exam Vitals:   03/10/16 2224 03/11/16 0535 03/11/16 1833 03/12/16 0519  BP: 120/71 (!) 109/50 112/61 112/73  Pulse: 91 68 70 76  Resp: 18 18 18 18   Temp: 98.5 F (36.9 C) 97.8 F (36.6 C) 98.1 F (36.7 C) 98 F (36.7 C)  TempSrc:   Oral Oral  SpO2:      Weight:      Height:       General: alert, cooperative and no distress Lochia: appropriate Uterine Fundus: firm Incision: Healing well with no significant drainage, No significant erythema, Dressing is clean, dry, and intact, skin well-approximated with sutures DVT Evaluation: No  evidence of DVT seen on physical exam. Negative Homan's sign. No cords or calf tenderness. No significant calf/ankle edema. Labs: Lab Results  Component Value Date   WBC 15.9 (H) 03/10/2016   HGB 8.6 (L) 03/10/2016   HCT 25.0 (L) 03/10/2016   MCV 83.1 03/10/2016   PLT 151 03/10/2016   No flowsheet data found.  Discharge instruction: per After Visit Summary and "Baby and Me Booklet".  After visit meds:    Medication List    TAKE these medications   ibuprofen 600 MG tablet Commonly known as:  ADVIL,MOTRIN Take 1 tablet (600 mg total) by mouth every 6 (six) hours.   iron polysaccharides 150 MG capsule Commonly known as:  NIFEREX Take 1 capsule (150 mg total) by mouth 2 (two) times daily.   Magnesium Oxide 400 (240 Mg) MG Tabs Take 400 mg by mouth daily.   ondansetron 4 MG tablet Commonly known  as:  ZOFRAN Take 1 tablet (4 mg total) by mouth 2 (two) times daily.   oxyCODONE-acetaminophen 5-325 MG tablet Commonly known as:  PERCOCET/ROXICET Take 1 tablet by mouth every 4 (four) hours as needed (pain scale 4-7).   PRENATAL VITAMIN PO Take 1 tablet by mouth every morning.       Diet: routine diet  Activity: Advance as tolerated. Pelvic rest for 6 weeks.   Outpatient follow up:6 weeks Follow up Appt:No future appointments. Follow up Visit:No Follow-up on file.  Postpartum contraception: Undecided  Newborn Data: Live born female on 03/09/2016 Birth Weight: 9 lb 10.7 oz (4385 g) APGAR: , 10  Baby Feeding: Breast Disposition:home with mother   03/12/2016 Raelyn Rios, Taylor Mainwaring, Taylor Rios, CNM

## 2016-03-12 NOTE — Lactation Note (Signed)
This note was copied from a baby's chart. Lactation Consultation Note  Planned to have consult but security had just escorted the FOB out of the room. Will try again later if possible. Appears that BF is going well. Patient Name: Boy Delphina CahillMaggie Drummer ZOXWR'UToday's Date: 03/12/2016     Maternal Data    Feeding    LATCH Score/Interventions                      Lactation Tools Discussed/Used     Consult Status      Soyla DryerJoseph, Marcy Sookdeo 03/12/2016, 12:05 PM

## 2016-03-12 NOTE — Discharge Instructions (Signed)
Breast Pumping Tips °If you are breastfeeding, there may be times when you cannot feed your baby directly. Returning to work or going on a trip are common examples. Pumping allows you to store breast milk and feed it to your baby later.  °You may not get much milk when you first start to pump. Your breasts should start to make more after a few days. If you pump at the times you usually feed your baby, you may be able to keep making enough milk to feed your baby without also using formula. The more often you pump, the more milk you will produce.  °WHEN SHOULD I PUMP?  °· You can begin to pump soon after delivery. However, some experts recommend waiting about 4 weeks before giving your infant a bottle to make sure breastfeeding is going well.  °· If you plan to return to work, begin pumping a few weeks before. This will help you develop techniques that work best for you. It also lets you build up a supply of breast milk.   °· When you are with your infant, feed on demand and pump after each feeding.   °· When you are away from your infant for several hours, pump for about 15 minutes every 2-3 hours. Pump both breasts at the same time if you can.   °· If your infant has a formula feeding, make sure to pump around the same time.     °· If you drink any alcohol, wait 2 hours before pumping.   °HOW DO I PREPARE TO PUMP? °Your let-down reflex is the natural reaction to stimulation that makes your breast milk flow. It is easier to stimulate this reflex when you are relaxed. Find relaxation techniques that work for you. If you have difficulty with your let-down reflex, try these methods:  °· Smell one of your infant's blankets or an item of clothing.   °· Look at a picture or video of your infant.   °· Sit in a quiet, private space.   °· Massage the breast you plan to pump.   °· Place soothing warmth on the breast.   °· Play relaxing music.   °WHAT ARE SOME GENERAL BREAST PUMPING TIPS? °· Wash your hands before you pump. You  do not need to wash your nipples or breasts. °· There are three ways to pump. °¨ You can use your hand to massage and compress your breast. °¨ You can use a handheld manual pump. °¨ You can use an electric pump.   °· Make sure the suction cup (flange) on the breast pump is the right size. Place the flange directly over the nipple. If it is the wrong size or placed the wrong way, it may be painful and cause nipple damage.   °· If pumping is uncomfortable, apply a small amount of purified or modified lanolin to your nipple and areola. °· If you are using an electric pump, adjust the speed and suction power to be more comfortable. °· If pumping is painful or if you are not getting very much milk, you may need a different type of pump. A lactation consultant can help you determine what type of pump to use.   °· Keep a full water bottle near you at all times. Drinking lots of fluid helps you make more milk.  °· You can store your milk to use later. Pumped breast milk can be stored in a sealable, sterile container or plastic bag. Label all stored breast milk with the date you pumped it. °¨ Milk can stay out at room temperature for up to 8 hours. °¨   You can store your milk in the refrigerator for up to 8 days. °¨ You can store your milk in the freezer for 3 months. Thaw frozen milk using warm water. Do not put it in the microwave. °· Do not smoke. Smoking can lower your milk supply and harm your infant. If you need help quitting, ask your health care provider to recommend a program.   °WHEN SHOULD I CALL MY HEALTH CARE PROVIDER OR A LACTATION CONSULTANT? °· You are having trouble pumping. °· You are concerned that you are not making enough milk. °· You have nipple pain, soreness, or redness. °· You want to use birth control. Birth control pills may lower your milk supply. Talk to your health care provider about your options. °  °This information is not intended to replace advice given to you by your health care provider.  Make sure you discuss any questions you have with your health care provider. °  °Document Released: 11/17/2009 Document Revised: 06/04/2013 Document Reviewed: 03/22/2013 °Elsevier Interactive Patient Education ©2016 [employer] °Postpartum Depression and Baby Blues °The postpartum period begins right after the birth of a baby. During this time, there is often a great amount of joy and excitement. It is also a time of many changes in the life of the parents. Regardless of how many times a mother gives birth, each child brings new challenges and dynamics to the family. It is not unusual to have feelings of excitement along with confusing shifts in moods, emotions, and thoughts. All mothers are at risk of developing postpartum depression or the "baby blues." These mood changes can occur right after giving birth, or they may occur many months after giving birth. The baby blues or postpartum depression can be mild or severe. Additionally, postpartum depression can go away rather quickly, or it can be a long-term condition.  °CAUSES °Raised hormone levels and the rapid drop in those levels are thought to be a main cause of postpartum depression and the baby blues. A number of hormones change during and after pregnancy. Estrogen and progesterone usually decrease right after the delivery of your baby. The levels of thyroid hormone and various cortisol steroids also rapidly drop. Other factors that play a role in these mood changes include major life events and genetics.  °RISK FACTORS °If you have any of the following risks for the baby blues or postpartum depression, know what symptoms to watch out for during the postpartum period. Risk factors that may increase the likelihood of getting the baby blues or postpartum depression include: °· Having a personal or family history of depression.   °· Having depression while being pregnant.   °· Having premenstrual mood issues or mood issues related to oral  contraceptives. °· Having a lot of life stress.   °· Having marital conflict.   °· Lacking a social support network.   °· Having a baby with special needs.   °· Having health problems, such as diabetes.   °SIGNS AND SYMPTOMS °Symptoms of baby blues include: °· Brief changes in mood, such as going from extreme happiness to sadness. °· Decreased concentration.   °· Difficulty sleeping.   °· Crying spells, tearfulness.   °· Irritability.   °· Anxiety.   °Symptoms of postpartum depression typically begin within the first month after giving birth. These symptoms include: °· Difficulty sleeping or excessive sleepiness.   °· Marked weight loss.   °· Agitation.   °· Feelings of worthlessness.   °· Lack of interest in activity or food.   °Postpartum psychosis is a very serious condition and can be dangerous. Fortunately, it is   rare. Displaying any of the following symptoms is cause for immediate medical attention. Symptoms of postpartum psychosis include:  °· Hallucinations and delusions.   °· Bizarre or disorganized behavior.   °· Confusion or disorientation.   °DIAGNOSIS  °A diagnosis is made by an evaluation of your symptoms. There are no medical or lab tests that lead to a diagnosis, but there are various questionnaires that a health care provider may use to identify those with the baby blues, postpartum depression, or psychosis. Often, a screening tool called the Edinburgh Postnatal Depression Scale is used to diagnose depression in the postpartum period.  °TREATMENT °The baby blues usually goes away on its own in 1-2 weeks. Social support is often all that is needed. You will be encouraged to get adequate sleep and rest. Occasionally, you may be given medicines to help you sleep.  °Postpartum depression requires treatment because it can last several months or longer if it is not treated. Treatment may include individual or group therapy, medicine, or both to address any social, physiological, and psychological factors  that may play a role in the depression. Regular exercise, a healthy diet, rest, and social support may also be strongly recommended.  °Postpartum psychosis is more serious and needs treatment right away. Hospitalization is often needed. °HOME CARE INSTRUCTIONS °· Get as much rest as you can. Nap when the baby sleeps.   °· Exercise regularly. Some women find yoga and walking to be beneficial.   °· Eat a balanced and nourishing diet.   °· Do little things that you enjoy. Have a cup of tea, take a bubble bath, read your favorite magazine, or listen to your favorite music. °· Avoid alcohol.   °· Ask for help with household chores, cooking, grocery shopping, or running errands as needed. Do not try to do everything.   °· Talk to people close to you about how you are feeling. Get support from your partner, family members, friends, or other new moms. °· Try to stay positive in how you think. Think about the things you are grateful for.   °· Do not spend a lot of time alone.   °· Only take over-the-counter or prescription medicine as directed by your health care provider. °· Keep all your postpartum appointments.   °· Let your health care provider know if you have any concerns.   °SEEK MEDICAL CARE IF: °You are having a reaction to or problems with your medicine. °SEEK IMMEDIATE MEDICAL CARE IF: °· You have suicidal feelings.   °· You think you may harm the baby or someone else. °MAKE SURE YOU: °· Understand these instructions. °· Will watch your condition. °· Will get help right away if you are not doing well or get worse. °  °This information is not intended to replace advice given to you by your health care provider. Make sure you discuss any questions you have with your health care provider. °  °Document Released: 03/03/2004 Document Revised: 06/04/2013 Document Reviewed: 03/11/2013 °Elsevier Interactive Patient Education ©2016 [employer] °Postpartum Care After Cesarean Delivery °After you deliver your newborn  (postpartum period), the usual stay in the hospital is 24-72 hours. If there were problems with your labor or delivery, or if you have other medical problems, you might be in the hospital longer.  °While you are in the hospital, you will receive help and instructions on how to care for yourself and your newborn during the postpartum period.  °While you are in the hospital: °· It is normal for you to have pain or discomfort from the incision in your   abdomen. Be sure to tell your nurses when you are having pain, where the pain is located, and what makes the pain worse.  If you are breastfeeding, you may feel uncomfortable contractions of your uterus for a couple of weeks. This is normal. The contractions help your uterus get back to normal size.  It is normal to have some bleeding after delivery.  For the first 1-3 days after delivery, the flow is red and the amount may be similar to a period.  It is common for the flow to start and stop.  In the first few days, you may pass some small clots. Let your nurses know if you begin to pass large clots or your flow increases.  Do not  flush blood clots down the toilet before having the nurse look at them.  During the next 3-10 days after delivery, your flow should become more watery and pink or brown-tinged in color.  Ten to fourteen days after delivery, your flow should be a small amount of yellowish-white discharge.  The amount of your flow will decrease over the first few weeks after delivery. Your flow may stop in 6-8 weeks. Most women have had their flow stop by 12 weeks after delivery.  You should change your sanitary pads frequently.  Wash your hands thoroughly with soap and water for at least 20 seconds after changing pads, using the toilet, or before holding or feeding your newborn.  Your intravenous (IV) tubing will be removed when you are drinking enough fluids.  The urine drainage tube (urinary catheter) that was inserted before delivery  may be removed within 6-8 hours after delivery or when feeling returns to your legs. You should feel like you need to empty your bladder within the first 6-8 hours after the catheter has been removed.  In case you become weak, lightheaded, or faint, call your nurse before you get out of bed for the first time and before you take a shower for the first time.  Within the first few days after delivery, your breasts may begin to feel tender and full. This is called engorgement. Breast tenderness usually goes away within 48-72 hours after engorgement occurs. You may also notice milk leaking from your breasts. If you are not breastfeeding, do not stimulate your breasts. Breast stimulation can make your breasts produce more milk.  Spending as much time as possible with your newborn is very important. During this time, you and your newborn can feel close and get to know each other. Having your newborn stay in your room (rooming in) will help to strengthen the bond with your newborn. It will give you time to get to know your newborn and become comfortable caring for your newborn.  Your hormones change after delivery. Sometimes the hormone changes can temporarily cause you to feel sad or tearful. These feelings should not last more than a few days. If these feelings last longer than that, you should talk to your caregiver.  If desired, talk to your caregiver about methods of family planning or contraception.  Talk to your caregiver about immunizations. Your caregiver may want you to have the following immunizations before leaving the hospital:  Tetanus, diphtheria, and pertussis (Tdap) or tetanus and diphtheria (Td) immunization. It is very important that you and your family (including grandparents) or others caring for your newborn are up-to-date with the Tdap or Td immunizations. The Tdap or Td immunization can help protect your newborn from getting ill.  Rubella immunization.  Varicella (chickenpox)  immunization.  Influenza immunization. You should receive this annual immunization if you did not receive the immunization during your pregnancy.   This information is not intended to replace advice given to you by your health care provider. Make sure you discuss any questions you have with your health care provider.   Document Released: 02/22/2012 Document Reviewed: 02/22/2012 Elsevier Interactive Patient Education [employer]  Breastfeeding Deciding to breastfeed is one of the best choices you can make for you and your baby. A change in hormones during pregnancy causes your breast tissue to grow and increases the number and size of your milk ducts. These hormones also allow proteins, sugars, and fats from your blood supply to make breast milk in your milk-producing glands. Hormones prevent breast milk from being released before your baby is born as well as prompt milk flow after birth. Once breastfeeding has begun, thoughts of your baby, as well as his or her sucking or crying, can stimulate the release of milk from your milk-producing glands.  BENEFITS OF BREASTFEEDING For Your Baby  Your first milk (colostrum) helps your baby's digestive system function better.  There are antibodies in your milk that help your baby fight off infections.  Your baby has a lower incidence of asthma, allergies, and sudden infant death syndrome.  The nutrients in breast milk are better for your baby than infant formulas and are designed uniquely for your baby's needs.  Breast milk improves your baby's brain development.  Your baby is less likely to develop other conditions, such as childhood obesity, asthma, or type 2 diabetes mellitus. For You  Breastfeeding helps to create a very special bond between you and your baby.  Breastfeeding is convenient. Breast milk is always available at the correct temperature and costs nothing.  Breastfeeding helps to burn calories and helps you lose the weight  gained during pregnancy.  Breastfeeding makes your uterus contract to its prepregnancy size faster and slows bleeding (lochia) after you give birth.   Breastfeeding helps to lower your risk of developing type 2 diabetes mellitus, osteoporosis, and breast or ovarian cancer later in life. SIGNS THAT YOUR BABY IS HUNGRY Early Signs of Hunger  Increased alertness or activity.  Stretching.  Movement of the head from side to side.  Movement of the head and opening of the mouth when the corner of the mouth or cheek is stroked (rooting).  Increased sucking sounds, smacking lips, cooing, sighing, or squeaking.  Hand-to-mouth movements.  Increased sucking of fingers or hands. Late Signs of Hunger  Fussing.  Intermittent crying. Extreme Signs of Hunger Signs of extreme hunger will require calming and consoling before your baby will be able to breastfeed successfully. Do not wait for the following signs of extreme hunger to occur before you initiate breastfeeding:  Restlessness.  A loud, strong cry.  Screaming. BREASTFEEDING BASICS Breastfeeding Initiation  Find a comfortable place to sit or lie down, with your neck and back well supported.  Place a pillow or rolled up blanket under your baby to bring him or her to the level of your breast (if you are seated). Nursing pillows are specially designed to help support your arms and your baby while you breastfeed.  Make sure that your baby's abdomen is facing your abdomen.  Gently massage your breast. With your fingertips, massage from your chest wall toward your nipple in a circular motion. This encourages milk flow. You may need to continue this action during the feeding if your milk flows slowly.  Support  your breast with 4 fingers underneath and your thumb above your nipple. Make sure your fingers are well away from your nipple and your baby's mouth.  Stroke your baby's lips gently with your finger or nipple.  When your baby's  mouth is open wide enough, quickly bring your baby to your breast, placing your entire nipple and as much of the colored area around your nipple (areola) as possible into your baby's mouth.  More areola should be visible above your baby's upper lip than below the lower lip.  Your baby's tongue should be between his or her lower gum and your breast.  Ensure that your baby's mouth is correctly positioned around your nipple (latched). Your baby's lips should create a seal on your breast and be turned out (everted).  It is common for your baby to suck about 2-3 minutes in order to start the flow of breast milk. Latching Teaching your baby how to latch on to your breast properly is very important. An improper latch can cause nipple pain and decreased milk supply for you and poor weight gain in your baby. Also, if your baby is not latched onto your nipple properly, he or she may swallow some air during feeding. This can make your baby fussy. Burping your baby when you switch breasts during the feeding can help to get rid of the air. However, teaching your baby to latch on properly is still the best way to prevent fussiness from swallowing air while breastfeeding. Signs that your baby has successfully latched on to your nipple:  Silent tugging or silent sucking, without causing you pain.  Swallowing heard between every 3-4 sucks.  Muscle movement above and in front of his or her ears while sucking. Signs that your baby has not successfully latched on to nipple:  Sucking sounds or smacking sounds from your baby while breastfeeding.  Nipple pain. If you think your baby has not latched on correctly, slip your finger into the corner of your baby's mouth to break the suction and place it between your baby's gums. Attempt breastfeeding initiation again. Signs of Successful Breastfeeding Signs from your baby:  A gradual decrease in the number of sucks or complete cessation of sucking.  Falling  asleep.  Relaxation of his or her body.  Retention of a small amount of milk in his or her mouth.  Letting go of your breast by himself or herself. Signs from you:  Breasts that have increased in firmness, weight, and size 1-3 hours after feeding.  Breasts that are softer immediately after breastfeeding.  Increased milk volume, as well as a change in milk consistency and color by the fifth day of breastfeeding.  Nipples that are not sore, cracked, or bleeding. Signs That Your Pecola Leisure is Getting Enough Milk  Wetting at least 3 diapers in a 24-hour period. The urine should be clear and pale yellow by age 31 days.  At least 3 stools in a 24-hour period by age 31 days. The stool should be soft and yellow.  At least 3 stools in a 24-hour period by age 25 days. The stool should be seedy and yellow.  No loss of weight greater than 10% of birth weight during the first 20 days of age.  Average weight gain of 4-7 ounces (113-198 g) per week after age 37 days.  Consistent daily weight gain by age 31 days, without weight loss after the age of 2 weeks. After a feeding, your baby may spit up a small amount. This  is common. BREASTFEEDING FREQUENCY AND DURATION Frequent feeding will help you make more milk and can prevent sore nipples and breast engorgement. Breastfeed when you feel the need to reduce the fullness of your breasts or when your baby shows signs of hunger. This is called "breastfeeding on demand." Avoid introducing a pacifier to your baby while you are working to establish breastfeeding (the first 4-6 weeks after your baby is born). After this time you may choose to use a pacifier. Research has shown that pacifier use during the first year of a baby's life decreases the risk of sudden infant death syndrome (SIDS). Allow your baby to feed on each breast as long as he or she wants. Breastfeed until your baby is finished feeding. When your baby unlatches or falls asleep while feeding from the first  breast, offer the second breast. Because newborns are often sleepy in the first few weeks of life, you may need to awaken your baby to get him or her to feed. Breastfeeding times will vary from baby to baby. However, the following rules can serve as a guide to help you ensure that your baby is properly fed:  Newborns (babies 824 weeks of age or younger) may breastfeed every 1-3 hours.  Newborns should not go longer than 3 hours during the day or 5 hours during the night without breastfeeding.  You should breastfeed your baby a minimum of 8 times in a 24-hour period until you begin to introduce solid foods to your baby at around 206 months of age. BREAST MILK PUMPING Pumping and storing breast milk allows you to ensure that your baby is exclusively fed your breast milk, even at times when you are unable to breastfeed. This is especially important if you are going back to work while you are still breastfeeding or when you are not able to be present during feedings. Your lactation consultant can give you guidelines on how long it is safe to store breast milk. A breast pump is a machine that allows you to pump milk from your breast into a sterile bottle. The pumped breast milk can then be stored in a refrigerator or freezer. Some breast pumps are operated by hand, while others use electricity. Ask your lactation consultant which type will work best for you. Breast pumps can be purchased, but some hospitals and breastfeeding support groups lease breast pumps on a monthly basis. A lactation consultant can teach you how to hand express breast milk, if you prefer not to use a pump. CARING FOR YOUR BREASTS WHILE YOU BREASTFEED Nipples can become dry, cracked, and sore while breastfeeding. The following recommendations can help keep your breasts moisturized and healthy:  Avoid using soap on your nipples.  Wear a supportive bra. Although not required, special nursing bras and tank tops are designed to allow access  to your breasts for breastfeeding without taking off your entire bra or top. Avoid wearing underwire-style bras or extremely tight bras.  Air dry your nipples for 3-354minutes after each feeding.  Use only cotton bra pads to absorb leaked breast milk. Leaking of breast milk between feedings is normal.  Use lanolin on your nipples after breastfeeding. Lanolin helps to maintain your skin's normal moisture barrier. If you use pure lanolin, you do not need to wash it off before feeding your baby again. Pure lanolin is not toxic to your baby. You may also hand express a few drops of breast milk and gently massage that milk into your nipples and allow the milk  to air dry. In the first few weeks after giving birth, some women experience extremely full breasts (engorgement). Engorgement can make your breasts feel heavy, warm, and tender to the touch. Engorgement peaks within 3-5 days after you give birth. The following recommendations can help ease engorgement:  Completely empty your breasts while breastfeeding or pumping. You may want to start by applying warm, moist heat (in the shower or with warm water-soaked hand towels) just before feeding or pumping. This increases circulation and helps the milk flow. If your baby does not completely empty your breasts while breastfeeding, pump any extra milk after he or she is finished.  Wear a snug bra (nursing or regular) or tank top for 1-2 days to signal your body to slightly decrease milk production.  Apply ice packs to your breasts, unless this is too uncomfortable for you.  Make sure that your baby is latched on and positioned properly while breastfeeding. If engorgement persists after 48 hours of following these recommendations, contact your health care provider or a Advertising copywriter. OVERALL HEALTH CARE RECOMMENDATIONS WHILE BREASTFEEDING  Eat healthy foods. Alternate between meals and snacks, eating 3 of each per day. Because what you eat affects your  breast milk, some of the foods may make your baby more irritable than usual. Avoid eating these foods if you are sure that they are negatively affecting your baby.  Drink milk, fruit juice, and water to satisfy your thirst (about 10 glasses a day).  Rest often, relax, and continue to take your prenatal vitamins to prevent fatigue, stress, and anemia.  Continue breast self-awareness checks.  Avoid chewing and smoking tobacco. Chemicals from cigarettes that pass into breast milk and exposure to secondhand smoke may harm your baby.  Avoid alcohol and drug use, including marijuana. Some medicines that may be harmful to your baby can pass through breast milk. It is important to ask your health care provider before taking any medicine, including all over-the-counter and prescription medicine as well as vitamin and herbal supplements. It is possible to become pregnant while breastfeeding. If birth control is desired, ask your health care provider about options that will be safe for your baby. SEEK MEDICAL CARE IF:  You feel like you want to stop breastfeeding or have become frustrated with breastfeeding.  You have painful breasts or nipples.  Your nipples are cracked or bleeding.  Your breasts are red, tender, or warm.  You have a swollen area on either breast.  You have a fever or chills.  You have nausea or vomiting.  You have drainage other than breast milk from your nipples.  Your breasts do not become full before feedings by the fifth day after you give birth.  You feel sad and depressed.  Your baby is too sleepy to eat well.  Your baby is having trouble sleeping.   Your baby is wetting less than 3 diapers in a 24-hour period.  Your baby has less than 3 stools in a 24-hour period.  Your baby's skin or the white part of his or her eyes becomes yellow.   Your baby is not gaining weight by 43 days of age. SEEK IMMEDIATE MEDICAL CARE IF:  Your baby is overly tired  (lethargic) and does not want to wake up and feed.  Your baby develops an unexplained fever.   This information is not intended to replace advice given to you by your health care provider. Make sure you discuss any questions you have with your health care provider.  Document Released: 05/30/2005 Document Revised: 02/18/2015 Document Reviewed: 11/21/2012 Elsevier Interactive Patient Education 2016 [employer] Breastfeeding and Mastitis Mastitis is inflammation of the breast tissue. It can occur in women who are breastfeeding. This can make breastfeeding painful. Mastitis will sometimes go away on its own. Your health care provider will help determine if treatment is needed. CAUSES Mastitis is often associated with a blocked milk (lactiferous) duct. This can happen when too much milk builds up in the breast. Causes of excess milk in the breast can include:  Poor latch-on. If your baby is not latched onto the breast properly, she or he may not empty your breast completely while breastfeeding.  Allowing too much time to pass between feedings.  Wearing a bra or other clothing that is too tight. This puts extra pressure on the lactiferous ducts so milk does not flow through them as it should. Mastitis can also be caused by a bacterial infection. Bacteria may enter the breast tissue through cuts or openings in the skin. In women who are breastfeeding, this may occur because of cracked or irritated skin. Cracks in the skin are often caused when your baby does not latch on properly to the breast. SIGNS AND SYMPTOMS  Swelling, redness, tenderness, and pain in an area of the breast.  Swelling of the glands under the arm on the same side.  Fever may or may not accompany mastitis. If an infection is allowed to progress, a collection of pus (abscess) may develop. DIAGNOSIS  Your health care provider can usually diagnose mastitis based on your symptoms and a physical exam. Tests may be done to help  confirm the diagnosis. These may include:  Removal of pus from the breast by applying pressure to the area. This pus can be examined in the lab to determine which bacteria are present. If an abscess has developed, the fluid in the abscess can be removed with a needle. This can also be used to confirm the diagnosis and determine the bacteria present. In most cases, pus will not be present.  Blood tests to determine if your body is fighting a bacterial infection.  Mammogram or ultrasound tests to rule out other problems or diseases. TREATMENT  Mastitis that occurs with breastfeeding will sometimes go away on its own. Your health care provider may choose to wait 24 hours after first seeing you to decide whether a prescription medicine is needed. If your symptoms are worse after 24 hours, your health care provider will likely prescribe an antibiotic medicine to treat the mastitis. He or she will determine which bacteria are most likely causing the infection and will then select an appropriate antibiotic medicine. This is sometimes changed based on the results of tests performed to identify the bacteria, or if there is no response to the antibiotic medicine selected. Antibiotic medicines are usually given by mouth. You may also be given medicine for pain. HOME CARE INSTRUCTIONS  Only take over-the-counter or prescription medicines for pain, fever, or discomfort as directed by your health care provider.  If your health care provider prescribed an antibiotic medicine, take the medicine as directed. Make sure you finish it even if you start to feel better.  Do not wear a tight or underwire bra. Wear a soft, supportive bra.  Increase your fluid intake, especially if you have a fever.  Continue to empty the breast. Your health care provider can tell you whether this milk is safe for your infant or needs to be thrown out. You may be  told to stop nursing until your health care provider thinks it is safe for  your baby. Use a breast pump if you are advised to stop nursing.  Keep your nipples clean and dry.  Empty the first breast completely before going to the other breast. If your baby is not emptying your breasts completely for some reason, use a breast pump to empty your breasts.  If you go back to work, pump your breasts while at work to stay in time with your nursing schedule.  Avoid allowing your breasts to become overly filled with milk (engorged). SEEK MEDICAL CARE IF:  You have pus-like discharge from the breast.  Your symptoms do not improve with the treatment prescribed by your health care provider within 2 days. SEEK IMMEDIATE MEDICAL CARE IF:  Your pain and swelling are getting worse.  You have pain that is not controlled with medicine.  You have a red line extending from the breast toward your armpit.  You have a fever or persistent symptoms for more than 2-3 days.  You have a fever and your symptoms suddenly get worse. MAKE SURE YOU:   Understand these instructions.  Will watch your condition.  Will get help right away if you are not doing well or get worse.   This information is not intended to replace advice given to you by your health care provider. Make sure you discuss any questions you have with your health care provider.   Document Released: 09/24/2004 Document Revised: 06/04/2013 Document Reviewed: 01/03/2013 Elsevier Interactive Patient Education [employer]

## 2016-03-14 ENCOUNTER — Encounter (HOSPITAL_COMMUNITY): Payer: Self-pay

## 2016-09-14 ENCOUNTER — Encounter: Payer: Self-pay | Admitting: Family Medicine

## 2016-10-11 ENCOUNTER — Encounter: Payer: Self-pay | Admitting: Family Medicine

## 2016-12-01 ENCOUNTER — Ambulatory Visit (INDEPENDENT_AMBULATORY_CARE_PROVIDER_SITE_OTHER): Payer: Self-pay | Admitting: Physician Assistant

## 2016-12-01 ENCOUNTER — Encounter: Payer: Self-pay | Admitting: Physician Assistant

## 2016-12-01 VITALS — BP 100/68 | HR 87 | Temp 98.0°F | Resp 16 | Wt 180.2 lb

## 2016-12-01 DIAGNOSIS — L739 Follicular disorder, unspecified: Secondary | ICD-10-CM

## 2016-12-01 MED ORDER — CEPHALEXIN 500 MG PO CAPS: 500.0000 mg | ORAL_CAPSULE | Freq: Four times a day (QID) | ORAL | 0 refills | Status: DC

## 2016-12-01 NOTE — Progress Notes (Signed)
Patient ID: Taylor Rios MRN: [id number], DOB: January 13, 1996, 21 y.o. Date of Encounter: 12/01/2016, 12:49 PM    Chief Complaint:  Chief Complaint  Patient presents with  . redness near c section incision    x1day     HPI: 21 y.o. year old female presents With above.  Her C-section was performed 03/09/16. Days that the C-section site had healed well with no complications. States that she has had no problems with her C-section site at all until she just noticed this yesterday.  Yesterday noticed that this area had some burning discomfort when she looked and saw that it looked inflamed so scheduled this visit.  Never felt any bite or sting at the site.  No other complaints or concerns to address.     Home Meds:   Outpatient Medications Prior to Visit  Medication Sig Dispense Refill  . ibuprofen (ADVIL,MOTRIN) 600 MG tablet Take 1 tablet (600 mg total) by mouth every 6 (six) hours. 30 tablet 0  . iron polysaccharides (NIFEREX) 150 MG capsule Take 1 capsule (150 mg total) by mouth 2 (two) times daily. 60 capsule 3  . Magnesium Oxide 400 (240 Mg) MG TABS Take 400 mg by mouth daily. 30 tablet 3  . ondansetron (ZOFRAN) 4 MG tablet Take 1 tablet (4 mg total) by mouth 2 (two) times daily. 20 tablet 0  . oxyCODONE-acetaminophen (PERCOCET/ROXICET) 5-325 MG tablet Take 1 tablet by mouth every 4 (four) hours as needed (pain scale 4-7). 30 tablet 0  . Prenatal Vit-Fe Fumarate-FA (PRENATAL VITAMIN PO) Take 1 tablet by mouth every morning.      No facility-administered medications prior to visit.     Allergies: No Known Allergies    Review of Systems: See HPI for pertinent ROS. All other ROS negative.    Physical Exam: Blood pressure 100/68, pulse 87, temperature 98 F (36.7 C), temperature source Oral, resp. rate 16, weight 180 lb 3.2 oz (81.7 kg), last menstrual period 11/08/2016, SpO2 98 %, unknown if currently breastfeeding., Body mass index is 28.22 kg/m. General:  WF  Appears in no acute distress. Neck: Supple. No thyromegaly. No lymphadenopathy. Lungs: Clear bilaterally to auscultation without wheezes, rales, or rhonchi. Breathing is unlabored. Heart: Regular rhythm. No murmurs, rubs, or gallops. Msk:  Strength and tone normal for age. Skin: She has a horizontal scar across pubic area. Just above this scar there is an area that is light pink and slightly raised and measures approximately 0.5 cm diameter. In the center of this is a tiny hole that is about 1 mm. They use some forceps to further evaluate this to make sure that it really is a whole rather than a tic or something above the skin. Even with palpation there is no drainage from this site. There is no firmness no abscess. Neuro: Alert and oriented X 3. Moves all extremities spontaneously. Gait is normal. CNII-XII grossly in tact. Psych:  Responds to questions appropriately with a normal affect.     ASSESSMENT AND PLAN:  21 y.o. year old female with  1. Folliculitis Suspect folliculitis or possible insect bite. She is to take the Keflex as directed. I told her to monitor the site and if it worsens then follow-up. Also if it is not improving or does not completely resolve upon completion of antibiotic and follow-up as well. - cephALEXin (KEFLEX) 500 MG capsule; Take 1 capsule (500 mg total) by mouth 4 (four) times daily.  Dispense: 28 capsule; Refill: 0   Signed,  [address] [place], [place], [facility] 12/01/2016 12:49 PM

## 2017-02-27 ENCOUNTER — Encounter: Payer: Self-pay | Admitting: Family Medicine

## 2017-04-13 ENCOUNTER — Encounter: Payer: Self-pay | Admitting: Family Medicine

## 2017-07-06 ENCOUNTER — Encounter: Payer: Self-pay | Admitting: Family Medicine

## 2017-08-30 DIAGNOSIS — Z118 Encounter for screening for other infectious and parasitic diseases: Secondary | ICD-10-CM | POA: Diagnosis not present

## 2017-08-30 DIAGNOSIS — N898 Other specified noninflammatory disorders of vagina: Secondary | ICD-10-CM | POA: Diagnosis not present

## 2017-08-30 DIAGNOSIS — N9089 Other specified noninflammatory disorders of vulva and perineum: Secondary | ICD-10-CM | POA: Diagnosis not present

## 2017-08-30 DIAGNOSIS — Z113 Encounter for screening for infections with a predominantly sexual mode of transmission: Secondary | ICD-10-CM | POA: Diagnosis not present

## 2017-09-14 DIAGNOSIS — N9089 Other specified noninflammatory disorders of vulva and perineum: Secondary | ICD-10-CM | POA: Diagnosis not present

## 2017-12-27 IMAGING — US US MFM OB DETAIL+14 WK
1 series · 13 of 28 positions shown · non-contrast
Comparison: none

[Series 2: us mfm ob detail+14 wk · 13 of 56 slices shown]
[im 3/56]
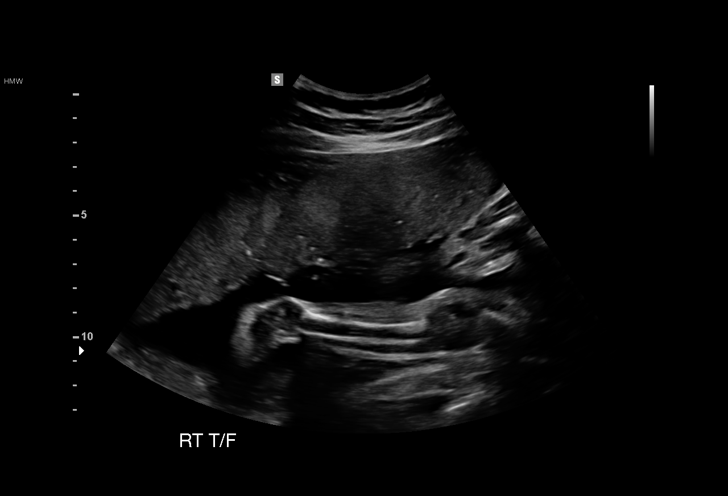
[im 7/56]
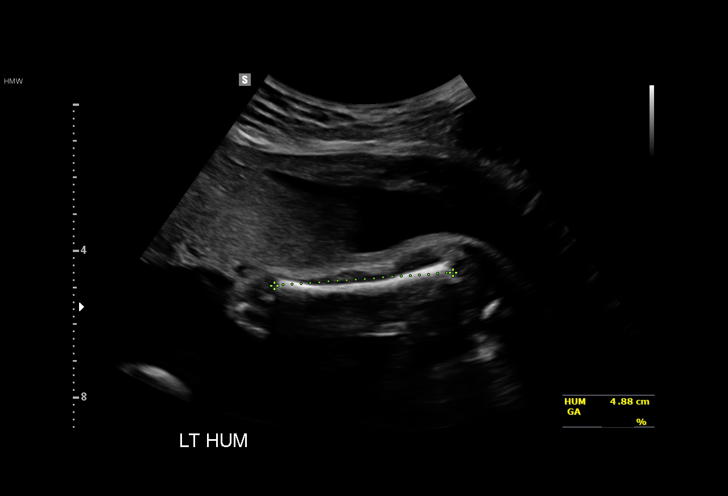
[im 11/56]
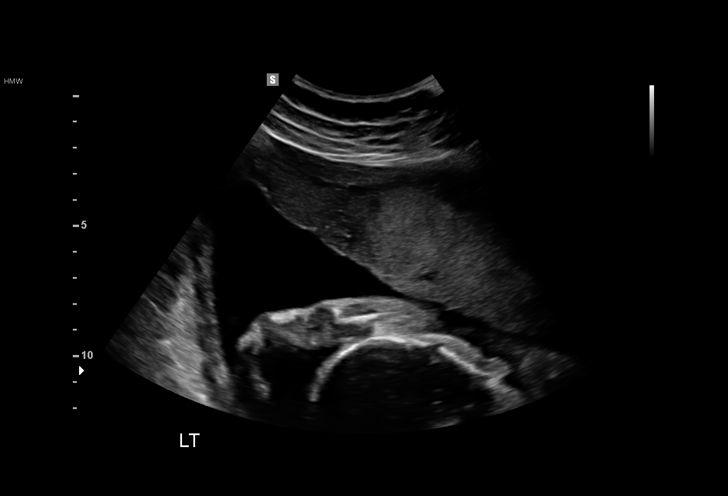
[im 15/56]
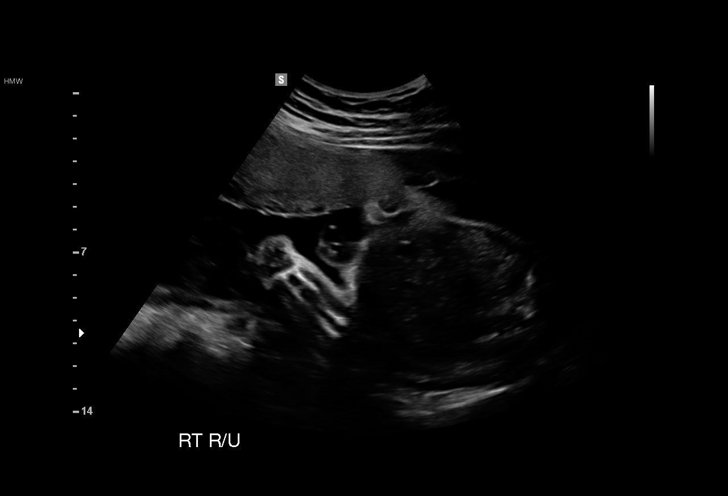
[im 19/56]
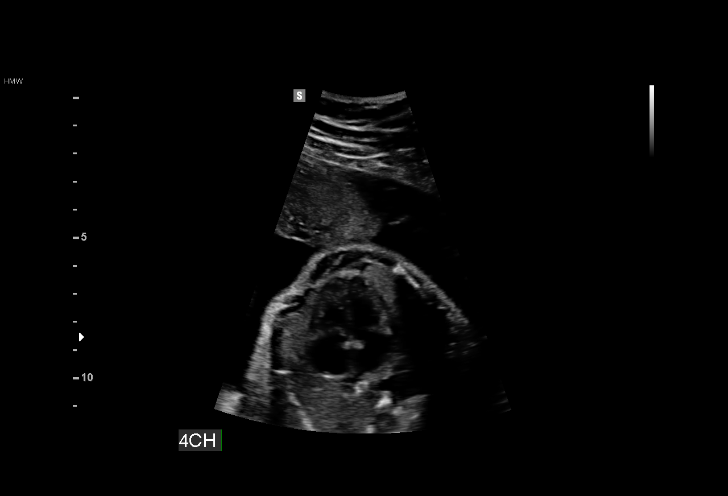
[im 23/56]
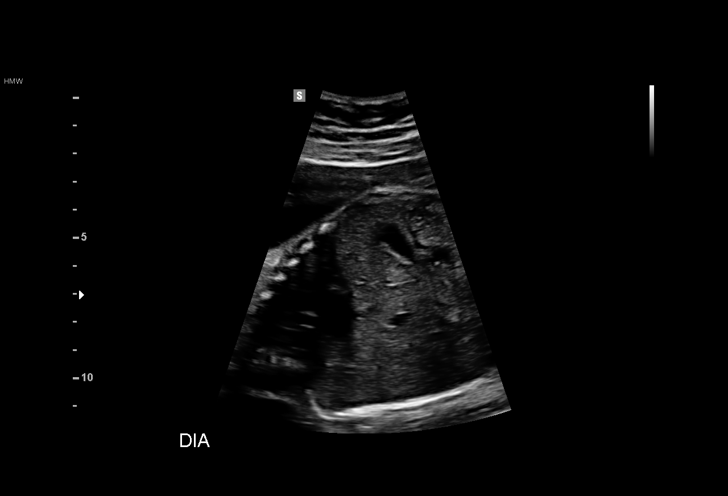
[im 29/56]
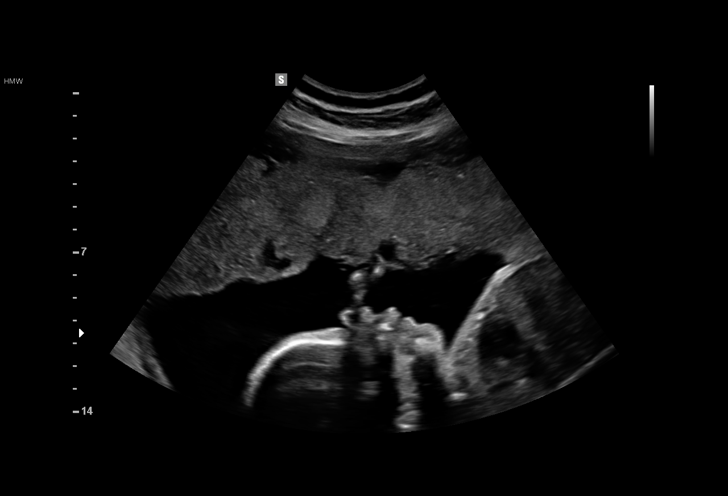
[im 33/56]
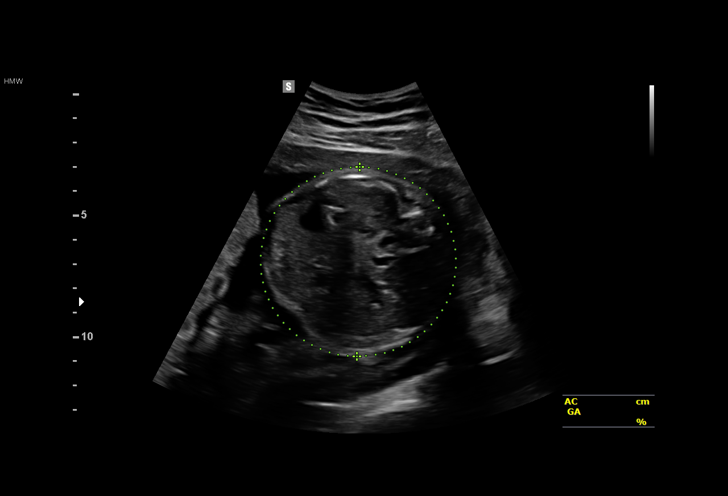
[im 37/56]
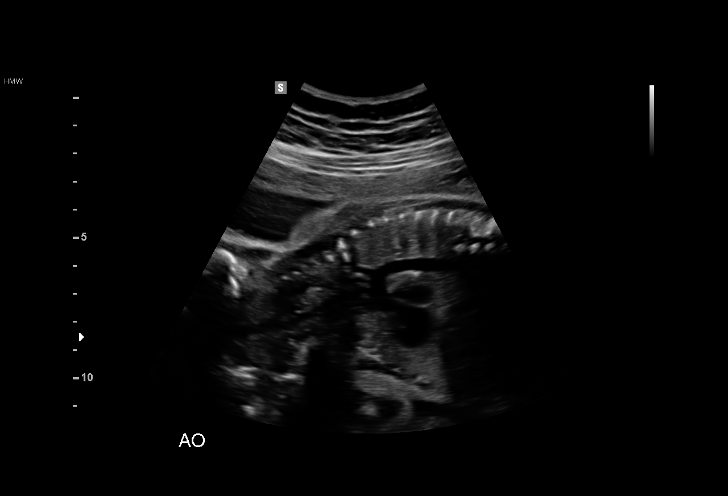
[im 41/56]
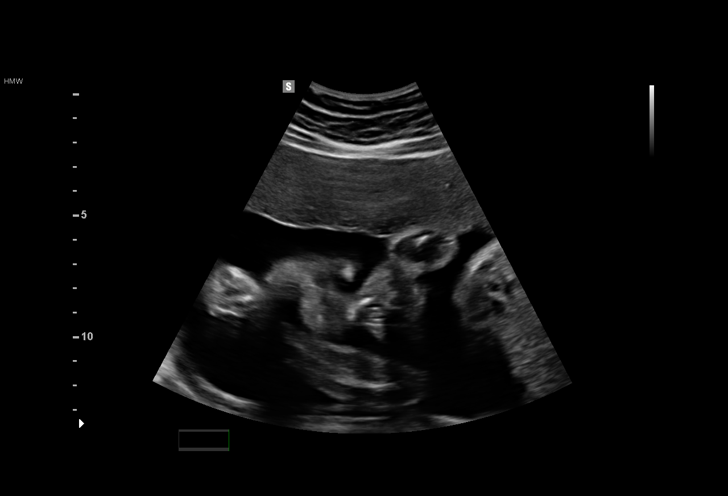
[im 45/56]
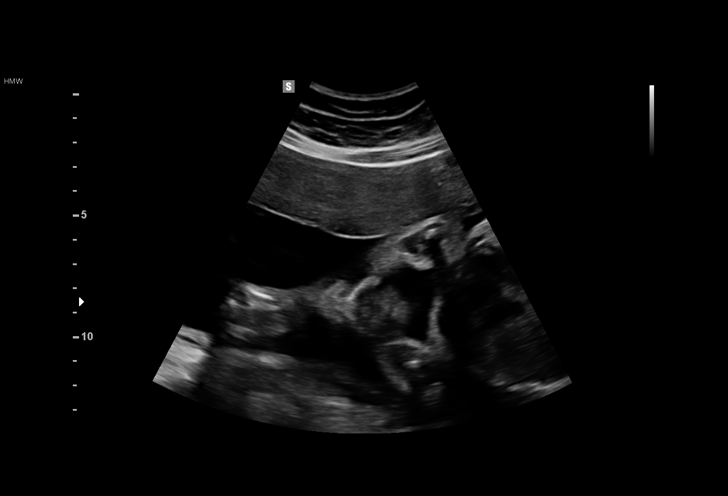
[im 49/56]
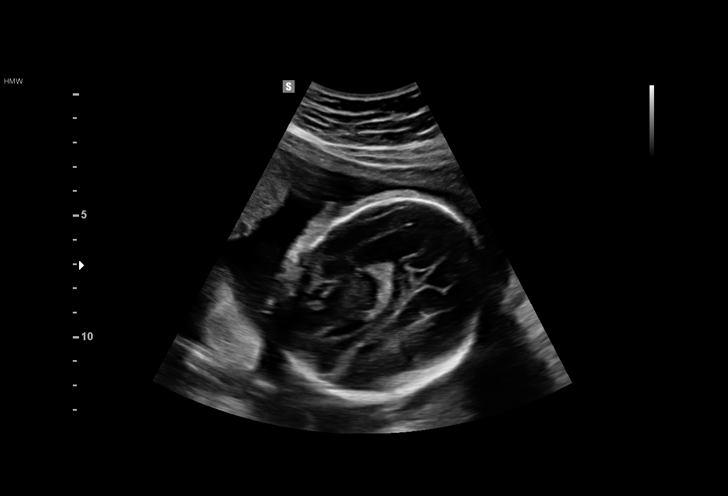
[im 53/56]
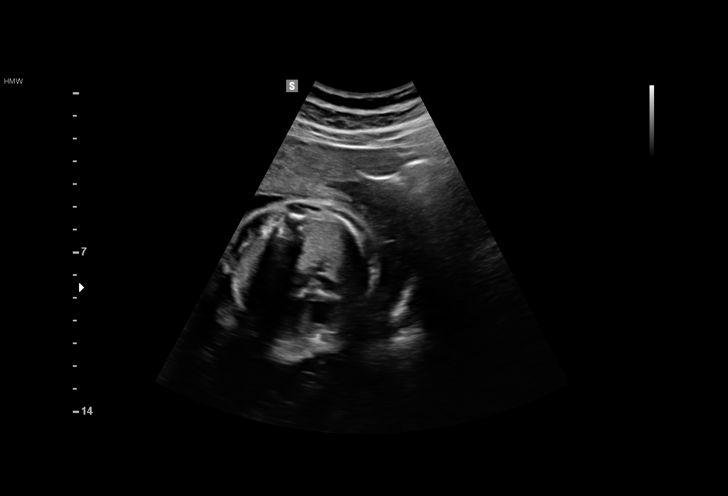

[13 of 28 positions shown; findings below may reference images not displayed]

& Infertility
2341 [REDACTED]

1  PAULUS N CEEJAY           [id number]      [id number]     [id number]
Indications

29 weeks gestation of pregnancy
OB History

Gravidity:    1
Fetal Evaluation

Num Of Fetuses:     1
Fetal Heart         134
Rate(bpm):
Cardiac Activity:   Observed
Presentation:       Oblique;  Head LUQ
Placenta:           Anterior, above cervical os
P. Cord Insertion:  Visualized, central

Amniotic Fluid
AFI FV:      Subjectively within normal limits

AFI Sum(cm)     %Tile       Largest Pocket(cm)
17.01           63

RUQ(cm)       RLQ(cm)       LUQ(cm)        LLQ(cm)
3.17
Biometry

BPD:      76.9  mm     G. Age:  30w 6d         87  %    CI:         75.1   %   70 - 86
FL/HC:      19.5   %   19.6 -
HC:      281.5  mm     G. Age:  30w 6d         68  %    HC/AC:      1.11       0.99 -
AC:      253.4  mm     G. Age:  29w 4d         55  %    FL/BPD:     71.4   %   71 - 87
FL:       54.9  mm     G. Age:  29w 0d         31  %    FL/AC:      21.7   %   20 - 24
HUM:      48.9  mm     G. Age:  28w 6d         36  %
CER:      33.8  mm     G. Age:  29w 3d         56  %

Est. FW:    8186  gm      3 lb 2 oz     60  %
Gestational Age

Clinical EDD:  29w 1d                                        EDD:   02/24/16
U/S Today:     30w 1d                                        EDD:   02/17/16
Best:          29w 1d    Det. By:   Clinical EDD             EDD:   02/24/16
Anatomy

Cranium:               Appears normal         Aortic Arch:            Appears normal
Cavum:                 Appears normal         Ductal Arch:            Appears normal
Ventricles:            Abnormal, see          Diaphragm:              Appears normal
comments
Choroid Plexus:        Appears normal         Stomach:                Appears normal, left
sided
Cerebellum:            Appears normal         Abdomen:                Appears normal
Posterior Fossa:       Appears normal         Abdominal Wall:         Appears nml (cord
insert, abd wall)
Nuchal Fold:           Not applicable (>20    Cord Vessels:           Appears normal (3
wks GA)                                        vessel cord)
Face:                  Appears normal         Kidneys:                Appear normal
(orbits and profile)
Lips:                  Appears normal         Bladder:                Appears normal
Thoracic:              Appears normal         Spine:                  Appears normal
Heart:                 Appears normal         Upper Extremities:      Appears normal
(4CH, axis, and situs
RVOT:                  Not well visualized    Lower Extremities:      Appears normal
LVOT:                  Appears normal

Other:  Male gender. Rt ventricle atrium measures .63mm.  Left ventricle
atrium measures 1.31 mm.
Cervix Uterus Adnexa

Cervix
Length:            3.7  cm.
Normal appearance by transabdominal scan.

Uterus
No abnormality visualized.

Left Ovary
Within normal limits.

Right Ovary
Not visualized.
Impression

Single IUP at 29w 1d
Mild left cerebral ventriculomegaly is noted (1.3 cm), but
poorly visualized due to fetal position (anterior lateral
ventricle)
The right cerebral ventricle appears normal
The remainder of the cranial anatomy appears normal
Somewhat limited views of the RVOT were obtained; the
remainder of the anatomic survey is normal
Fetal growth is appropriate (60th %tile)
Anterior placenta without previa
Normal amniotic fluid volume

Ultrasound findings and limitations were discussed.  The
anterior ventricle was poorly visualized and may be an
overestimation in size.  We briefly discussed risks associated
with cerebral ventriculomegaly to include aneuploidy and fetal
viral infections (TORCH).  The patient declined further work
up at this time.
Recommendations

Recommend follow-up ultrasound examination in 4 weeks for
growth - will reevaluate the cerebral ventricles at that time.  If
unable to visualize well, may consider imaging studies of the
newborn after delivery.

## 2018-01-24 IMAGING — US US MFM OB FOLLOW-UP
1 series · 14 of 28 positions shown · non-contrast
Comparison: none

& Infertility
3867 [REDACTED]

1  KWAI YUEN PERTAMA            [id number]      [id number]     [id number]
Indications
33 weeks gestation of pregnancy
Cerebral ventriculomegaly (left vent)
Follow-up incomplete fetal anatomic            Z36
evaluation
OB History
Gravidity:    1
Fetal Evaluation
Num Of Fetuses:     1
Fetal Heart         133
Rate(bpm):
Cardiac Activity:   Observed
Presentation:       Cephalic
Placenta:           Anterior, above cervical os
P. Cord Insertion:  Previously Visualized
Amniotic Fluid
AFI FV:      Subjectively within normal limits
AFI Sum(cm)     %Tile       Largest Pocket(cm)
19.79           74
RUQ(cm)       RLQ(cm)       LUQ(cm)        LLQ(cm)
7.49
Biometry
BPD:        86  mm     G. Age:  34w 5d         85  %    CI:        74.85   %   70 - 86
FL/HC:      20.1   %   19.9 -
HC:      315.4  mm     G. Age:  35w 3d         72  %    HC/AC:      1.07       0.96 -
AC:      295.6  mm     G. Age:  33w 4d         63  %    FL/BPD:     73.8   %   71 - 87
FL:       63.5  mm     G. Age:  32w 6d         30  %    FL/AC:      21.5   %   20 - 24
HUM:      55.3  mm     G. Age:  32w 1d         39  %
Est. FW:    9907  gm    4 lb 15 oz      68  %
Gestational Age
Clinical EDD:  33w 1d                                        EDD:   02/24/16
U/S Today:     34w 1d                                        EDD:   02/17/16
Best:          33w 1d    Det. By:   Clinical EDD             EDD:   02/24/16
Anatomy
Cranium:               Appears normal         Aortic Arch:            Appears normal
Cavum:                 Previously seen        Ductal Arch:            Previously seen
Ventricles:            Rt vis wnl, Lt not     Diaphragm:              Previously seen
well vis
Choroid Plexus:        Previously seen        Stomach:                Appears normal, left
sided
Cerebellum:            Previously seen        Abdomen:                Previously seen
Posterior Fossa:       Previously seen        Abdominal Wall:         Previously seen
Nuchal Fold:           Not applicable (>20    Cord Vessels:           Previously seen
wks GA)
Face:                  Orbits and profile     Kidneys:                Appear normal
previously seen
Lips:                  Previously seen        Bladder:                Appears normal
Thoracic:              Appears normal         Spine:                  Previously seen
Heart:                 Appears normal         Upper Extremities:      Previously seen
(4CH, axis, and situs
RVOT:                  Appears normal         Lower Extremities:      Previously seen
LVOT:                  Previously seen
Other:  Male gender previously seen. Rt ventricle atrium measures .74mm.
Left ventricle atrium previously measured 1.31 mm. Technically
difficult due to advanced GA and fetal position.
Cervix Uterus Adnexa
Cervix
Not visualized (advanced GA >48wks)
Uterus
No abnormality visualized.
Left Ovary
Not visualized.
Right Ovary
Adnexa:       No abnormality visualized. No adnexal mass
visualized.
Impression
INDICATION: 20 yr old G1P0 at 99w6d with borderline fetal
ventriculomegaly for follow up ultrasound to complete
anatomic survey and reevaluate lateral ventricles.

[Series 1: us mfm ob follow-up · 53 acquisitions, 14 frames shown]
[im 2/53]
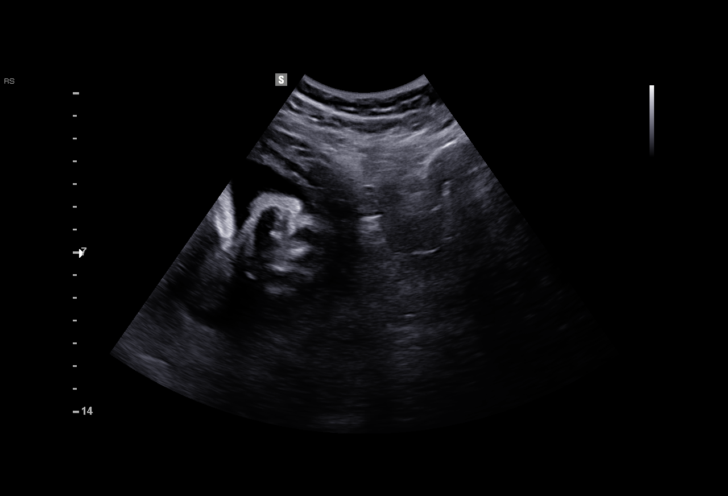
[im 6/53]
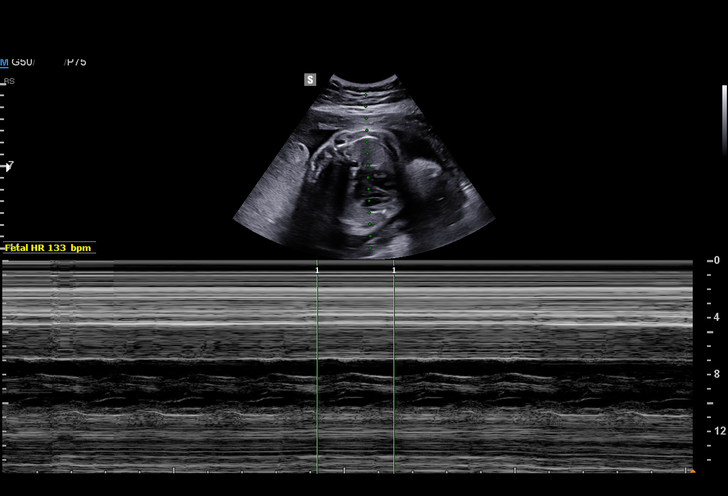
[im 10/53]
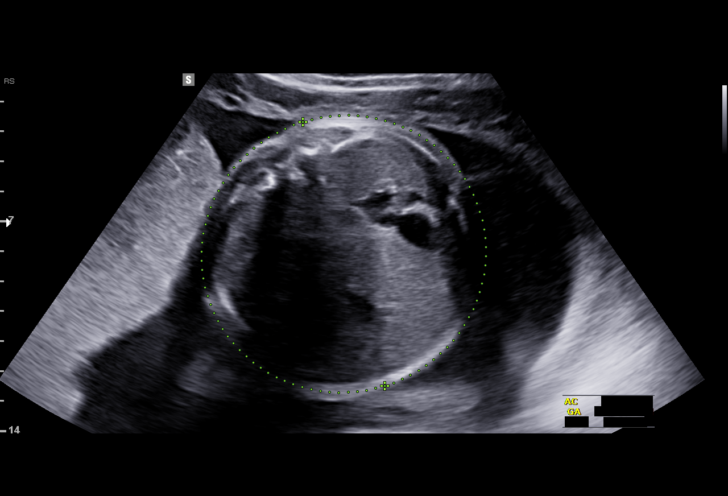
[im 14/53]
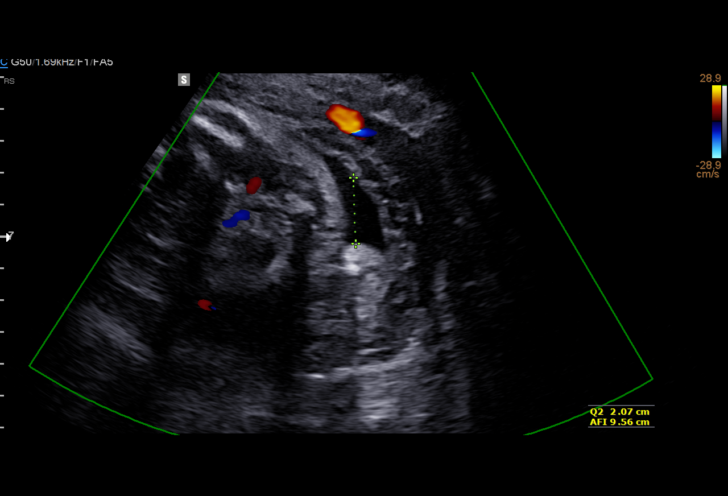
[im 18/53]
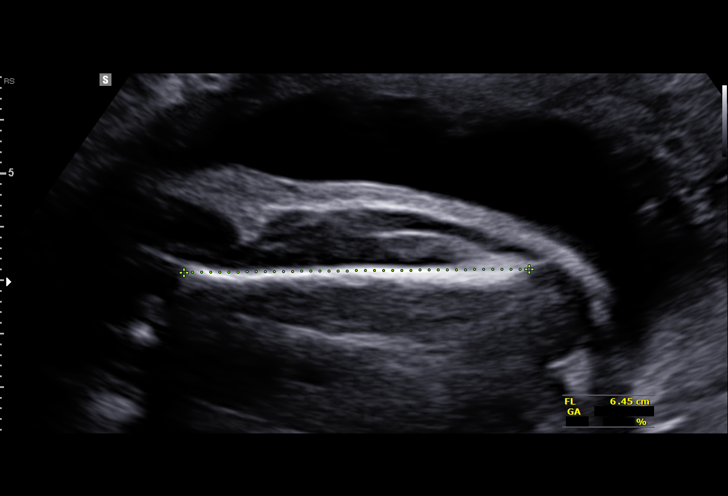
[im 22/53]
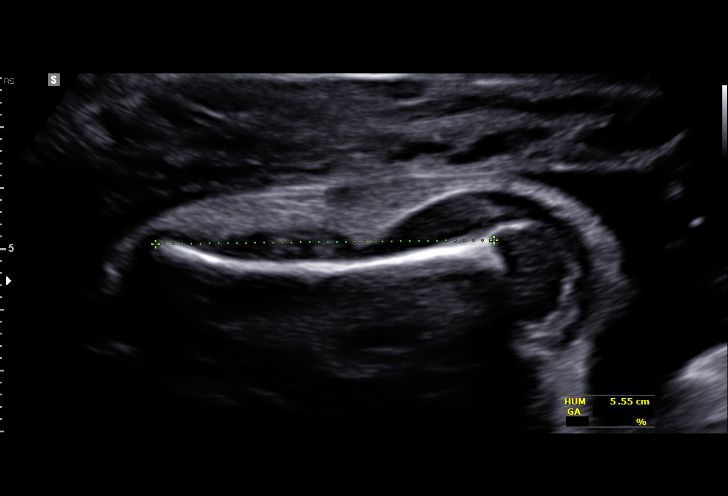
[im 26/53]
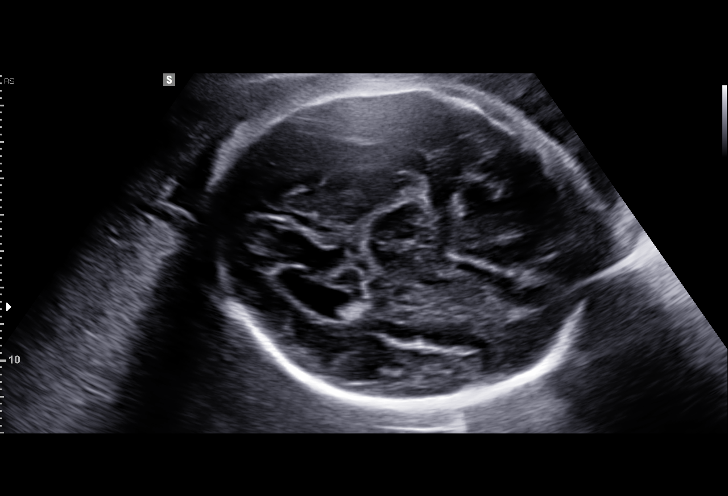
[im 29/53]
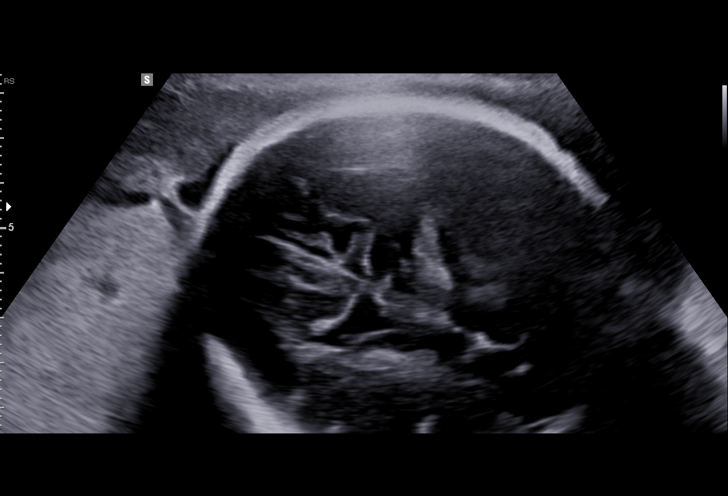
[im 33/53]
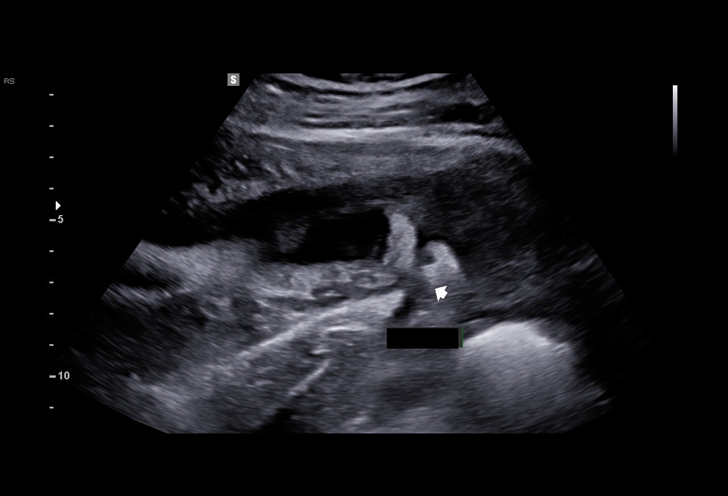
[im 37/53]
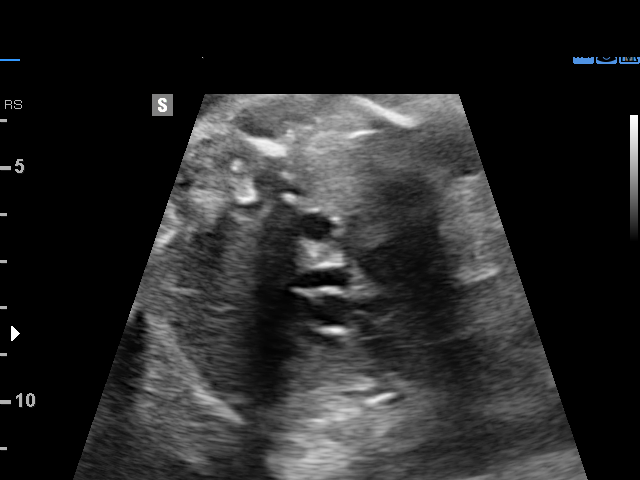
[im 41/53]
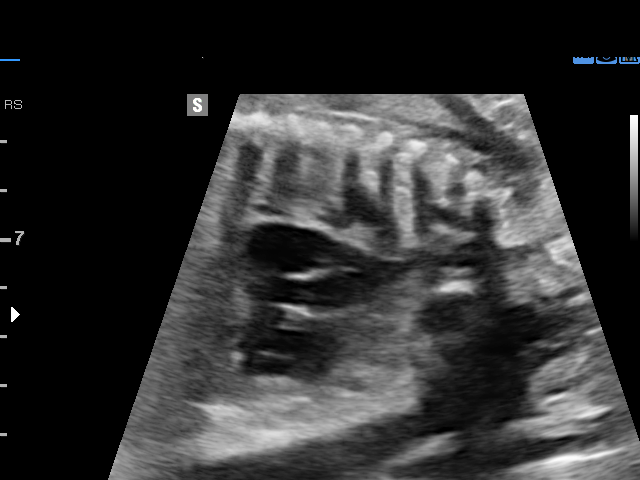
[im 45/53]
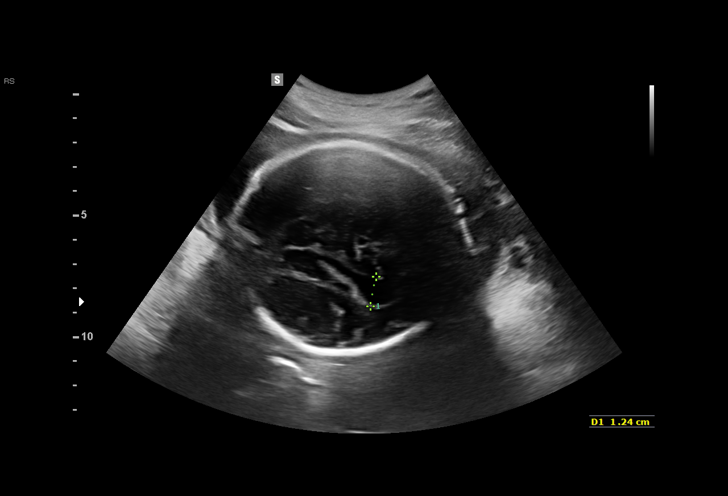
[im 49/53]
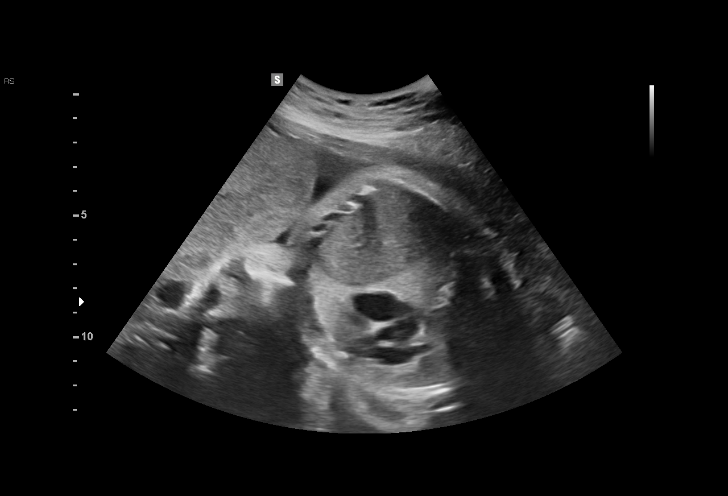
[im 53/53]
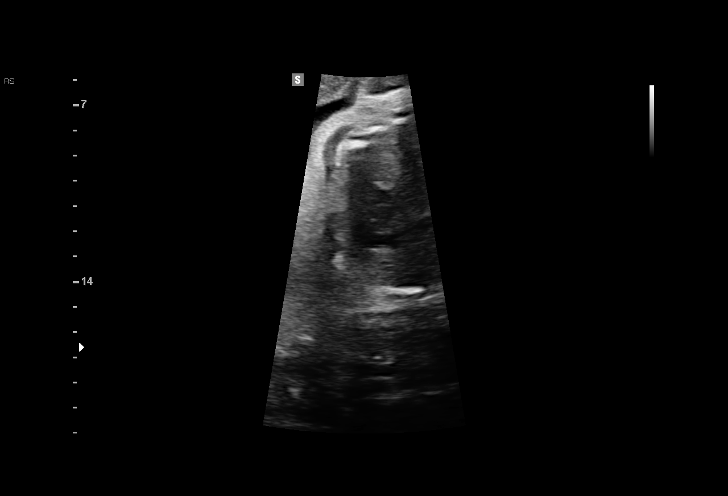

[14 of 28 positions shown; findings below may reference images not displayed]

FINDINGS: 1. Single intrauterine pregnancy.
2. Estimated fetal weight is in the 68th%.
3. Anterior placenta without evidence of previa.
4. Normal amniotic fluid index.
5. Again the lateral ventricles appear borderline measuring 1-
1.2cm.
6. The remainder of the limited anatomy survey is normal.
Any anatomy not evaluated on today's exam was evaluated
on the previous exam.
Recommendations

1. Appropriate fetal growth.
2. Fetal anatomic survey is complete.
3. Borderline lateral ventriculomegaly:
- previously counseled
- declined infectious work up
- recommend reevalaute in 3-4 weeks
- if persistent recommend inform Pediatrics at delivery

## 2018-02-07 DIAGNOSIS — Z113 Encounter for screening for infections with a predominantly sexual mode of transmission: Secondary | ICD-10-CM | POA: Diagnosis not present

## 2018-02-07 DIAGNOSIS — Z6829 Body mass index (BMI) 29.0-29.9, adult: Secondary | ICD-10-CM | POA: Diagnosis not present

## 2018-02-07 DIAGNOSIS — Z118 Encounter for screening for other infectious and parasitic diseases: Secondary | ICD-10-CM | POA: Diagnosis not present

## 2018-02-07 DIAGNOSIS — Z01419 Encounter for gynecological examination (general) (routine) without abnormal findings: Secondary | ICD-10-CM | POA: Diagnosis not present

## 2018-04-16 DIAGNOSIS — R87612 Low grade squamous intraepithelial lesion on cytologic smear of cervix (LGSIL): Secondary | ICD-10-CM | POA: Diagnosis not present

## 2018-05-01 DIAGNOSIS — Z118 Encounter for screening for other infectious and parasitic diseases: Secondary | ICD-10-CM | POA: Diagnosis not present

## 2018-05-01 DIAGNOSIS — Z1159 Encounter for screening for other viral diseases: Secondary | ICD-10-CM | POA: Diagnosis not present

## 2018-05-01 DIAGNOSIS — B373 Candidiasis of vulva and vagina: Secondary | ICD-10-CM | POA: Diagnosis not present

## 2018-05-01 DIAGNOSIS — Z114 Encounter for screening for human immunodeficiency virus [HIV]: Secondary | ICD-10-CM | POA: Diagnosis not present

## 2018-05-01 DIAGNOSIS — Z113 Encounter for screening for infections with a predominantly sexual mode of transmission: Secondary | ICD-10-CM | POA: Diagnosis not present

## 2018-05-02 ENCOUNTER — Encounter: Payer: Self-pay | Admitting: Family Medicine

## 2018-05-02 ENCOUNTER — Ambulatory Visit: Payer: BLUE CROSS/BLUE SHIELD | Admitting: Family Medicine

## 2018-05-02 VITALS — BP 116/78 | HR 66 | Temp 98.5°F | Resp 16 | Ht 67.0 in | Wt 181.2 lb

## 2018-05-02 DIAGNOSIS — J309 Allergic rhinitis, unspecified: Secondary | ICD-10-CM | POA: Diagnosis not present

## 2018-05-02 DIAGNOSIS — J209 Acute bronchitis, unspecified: Secondary | ICD-10-CM

## 2018-05-02 MED ORDER — PREDNISONE 20 MG PO TABS: ORAL_TABLET | ORAL | 0 refills | Status: DC

## 2018-05-02 MED ORDER — CETIRIZINE HCL 10 MG PO TBDP: 10.0000 mg | ORAL_TABLET | Freq: Every day | ORAL | 0 refills | Status: DC

## 2018-05-02 MED ORDER — GUAIFENESIN ER 600 MG PO TB12: 600.0000 mg | ORAL_TABLET | Freq: Two times a day (BID) | ORAL | 0 refills | Status: AC

## 2018-05-02 MED ORDER — ALBUTEROL SULFATE HFA 108 (90 BASE) MCG/ACT IN AERS: 2.0000 | INHALATION_SPRAY | RESPIRATORY_TRACT | 0 refills | Status: DC | PRN

## 2018-05-02 MED ORDER — FLUTICASONE PROPIONATE 50 MCG/ACT NA SUSP: 2.0000 | Freq: Every day | NASAL | 2 refills | Status: DC

## 2018-05-02 NOTE — Progress Notes (Signed)
Patient ID: Taylor Rios, female    DOB: [birth year], 22 y.o.   MRN: [id number]  PCP: Donita BrooksPickard, Warren T, MD  Chief Complaint  Patient presents with  . Wheezing    Patient has c/o feeling sob with wheezing     Subjective:   Taylor Rios is a 22 y.o. female, presents to clinic with CC of chest congestion , productive cough with green mucous and associated wheeze, chest tightness, and SOB when trying to go to the gym to work out.  Her sx started about 8 days ago and have started to get better except for the tightness, wheeze and SOB that she feels when trying to exercise, located in her upper central chest. She denies hx of asthma, denies smoking hx.  She reports hx of bronchitis as a child.  nonsmoker Denies fever, chills, sweats, rash, fatigue, N, V, D, CP  Patient Active Problem List   Diagnosis Date Noted  . Postpartum care following cesarean delivery (9/27) 03/10/2016  . Acute blood loss anemia 03/10/2016  . Post term pregnancy over 40 weeks 03/09/2016     Prior to Admission medications   Medication Sig Start Date End Date Taking? Authorizing Provider  fluconazole (DIFLUCAN) 150 MG tablet  05/01/18  Yes [provider]  valACYclovir (VALTREX) 500 MG tablet TAKE 1 TABLET BY MOUTH TWICE A DAY FOR 3 TO 5 DAYS WITH OUTBREAK 04/03/18  Yes [provider]  albuterol (PROVENTIL HFA;VENTOLIN HFA) 108 (90 Base) MCG/ACT inhaler Inhale 2 puffs into the lungs every 4 (four) hours as needed for wheezing or shortness of breath. 05/02/18   Danelle Berryapia, Jeoffrey Eleazer, PA-C  Cetirizine HCl (ZYRTEC ALLERGY) 10 MG TBDP Take 10 mg by mouth at bedtime. 05/02/18   Danelle Berryapia, Gussie Murton, PA-C  fluticasone (FLONASE) 50 MCG/ACT nasal spray Place 2 sprays into both nostrils daily. 05/02/18   Danelle Berryapia, Delorise Hunkele, PA-C  guaiFENesin (MUCINEX) 600 MG 12 hr tablet Take 1 tablet (600 mg total) by mouth 2 (two) times daily for 7 days. 05/02/18 05/09/18  Danelle Berryapia, Deaun Rocha, PA-C  predniSONE (DELTASONE) 20 MG tablet 2  tabs poqday 1-3, 1 tabs poqday 4-6 05/02/18   Danelle Berryapia, Rilyn Scroggs, PA-C     No Known Allergies   Family History  Problem Relation Age of Onset  . Hypertension Mother   . Cancer Father   . Diabetes Maternal Aunt   . Asthma Maternal Grandmother   . Diabetes Maternal Grandmother   . Miscarriages / Stillbirths Maternal Grandmother   . Alcohol abuse Neg Hx   . Arthritis Neg Hx   . Birth defects Neg Hx   . COPD Neg Hx   . Depression Neg Hx   . Drug abuse Neg Hx   . Early death Neg Hx   . Hearing loss Neg Hx   . Heart disease Neg Hx   . Hyperlipidemia Neg Hx   . Kidney disease Neg Hx   . Learning disabilities Neg Hx   . Mental illness Neg Hx   . Mental retardation Neg Hx   . Stroke Neg Hx   . Vision loss Neg Hx   . Varicose Veins Neg Hx      Social History   Socioeconomic History  . Marital status: Single    Spouse name: Not on file  . Number of children: Not on file  . Years of education: Not on file  . Highest education level: Not on file  Occupational History  . Not on file  Social Needs  .  Financial resource strain: Not on file  . Food insecurity:    Worry: Not on file    Inability: Not on file  . Transportation needs:    Medical: Not on file    Non-medical: Not on file  Tobacco Use  . Smoking status: Never Smoker  . Smokeless tobacco: Never Used  Substance and Sexual Activity  . Alcohol use: No    Alcohol/week: 0.0 standard drinks  . Drug use: No  . Sexual activity: Yes  Lifestyle  . Physical activity:    Days per week: Not on file    Minutes per session: Not on file  . Stress: Not on file  Relationships  . Social connections:    Talks on phone: Not on file    Gets together: Not on file    Attends religious service: Not on file    Active member of club or organization: Not on file    Attends meetings of clubs or organizations: Not on file    Relationship status: Not on file  . Intimate partner violence:    Fear of current or ex partner: Not on file     Emotionally abused: Not on file    Physically abused: Not on file    Forced sexual activity: Not on file  Other Topics Concern  . Not on file  Social History Narrative  . Not on file     Review of Systems  Constitutional: Negative.   HENT: Negative.   Eyes: Negative.   Respiratory: Negative.   Cardiovascular: Negative.   Gastrointestinal: Negative.   Endocrine: Negative.   Genitourinary: Negative.   Musculoskeletal: Negative.   Skin: Negative.   Allergic/Immunologic: Negative.   Neurological: Negative.   Hematological: Negative.   Psychiatric/Behavioral: Negative.   All other systems reviewed and are negative.      Objective:    Vitals:   05/02/18 0823  BP: 116/78  Pulse: 66  Resp: 16  Temp: 98.5 F (36.9 C)  TempSrc: Oral  SpO2: 98%  Weight: 181 lb 4 oz (82.2 kg)  Height: 5\' 7"  (1.702 m)      Physical Exam  Constitutional: She is oriented to person, place, and time. She appears well-developed and well-nourished.  Non-toxic appearance. No distress.  Non-toxic but mildly ill appearing female  HENT:  Head: Normocephalic and atraumatic.  Right Ear: External ear normal.  Left Ear: External ear normal.  Mouth/Throat: Uvula is midline, oropharynx is clear and moist and mucous membranes are normal. No oropharyngeal exudate.  Nasal turbinates edematous  Eyes: Pupils are equal, round, and reactive to light. Conjunctivae, EOM and lids are normal. No scleral icterus.  Neck: Normal range of motion and phonation normal. Neck supple. No tracheal deviation present.  Cardiovascular: Normal rate, regular rhythm, normal heart sounds and normal pulses. Exam reveals no gallop and no friction rub.  No murmur heard. Pulses:      Radial pulses are 2+ on the right side, and 2+ on the left side.       Posterior tibial pulses are 2+ on the right side, and 2+ on the left side.  Pulmonary/Chest: Effort normal and breath sounds normal. No stridor. No respiratory distress. She has no  wheezes. She has no rhonchi. She has no rales. She exhibits no tenderness.  Audible exp wheeze only heard with coughing, with auscultation no wheeze, rales, rhonchi No tachypnea, retractions, accessory muscle use Frequent coughing  Abdominal: Soft. Normal appearance and bowel sounds are normal. She exhibits no distension. There  is no tenderness. There is no rebound and no guarding.  Musculoskeletal: Normal range of motion. She exhibits no edema or deformity.  Lymphadenopathy:    She has no cervical adenopathy.  Neurological: She is alert and oriented to person, place, and time. She exhibits normal muscle tone. Gait normal.  Skin: Skin is warm, dry and intact. Capillary refill takes less than 2 seconds. No rash noted. She is not diaphoretic. No pallor.  Psychiatric: She has a normal mood and affect. Her speech is normal and behavior is normal.  Nursing note and vitals reviewed.         Assessment & Plan:      ICD-10-CM   1. Acute bronchitis, unspecified organism J20.9 guaiFENesin (MUCINEX) 600 MG 12 hr tablet    predniSONE (DELTASONE) 20 MG tablet    albuterol (PROVENTIL HFA;VENTOLIN HFA) 108 (90 Base) MCG/ACT inhaler  2. Allergic rhinitis, unspecified seasonality, unspecified trigger J30.9 fluticasone (FLONASE) 50 MCG/ACT nasal spray    Cetirizine HCl (ZYRTEC ALLERGY) 10 MG TBDP    Bronchitis and allergic rhinitis, pt afebrile, VSS, sx for 1 weeks are starting to improve except for some tightness and SOB with exercise and continued cough.   No concern for CAP.  No hx of lung disease or immunocompromise. Nasal sx appear most consistent with allergic rhinitis - encouraged pt to tx  Tx bronchitis with steroid, inhaler and mucinex    Danelle Berry, PA-C 05/02/18 8:52 AM

## 2018-05-02 NOTE — Patient Instructions (Signed)
Bronchitis is usually inflammation from allergies or viruses that makes the upper airways swollen and tight with spasms of the airway causing wheeze and sometimes shortness of breath. Its ok to keep coughing up mucous and I would expect you to gradually get better (coughing) in the next 1-2 weeks.  The tightness and shortness of breath should improve a lot in the next ~5 days.  Start steroids today and take in the morning for the next several days.  Use the albuterol inhaler every 4-6 hours as needed for wheeze, tight coughing fits, shortness of breath.  The medicine only lasts in your lungs for about 2 hours, so in the first 1-2 days it is okay to use your albuterol inhaler every 2 hours, and then as the steroids begin to work, you should only need it every 4-6 hours or less.  *If you are having any severe difficulty breathing or wheeze, you can use the albuterol inhaler, 2 puffs every 20 minutes, and can repeat 2 times however if you are still distressed having try the inhaler 3 times within 1 hour you need to call 911 or go to the ER.  Take Mucinex daily, as directed on box or bottle, and drink ample water. See the allergy meds I put on the chart - they are over the counter - taking flonase and zyrtec (or similar equivalent) for the next 2 weeks should help improve nasal symptoms and post nasal drip

## 2018-05-11 DIAGNOSIS — J069 Acute upper respiratory infection, unspecified: Secondary | ICD-10-CM | POA: Diagnosis not present

## 2018-05-11 DIAGNOSIS — R072 Precordial pain: Secondary | ICD-10-CM | POA: Diagnosis not present

## 2018-05-11 DIAGNOSIS — M940 Chondrocostal junction syndrome [Tietze]: Secondary | ICD-10-CM | POA: Diagnosis not present

## 2018-05-11 DIAGNOSIS — F419 Anxiety disorder, unspecified: Secondary | ICD-10-CM | POA: Diagnosis not present

## 2019-01-16 DIAGNOSIS — L280 Lichen simplex chronicus: Secondary | ICD-10-CM | POA: Diagnosis not present

## 2019-01-22 DIAGNOSIS — L301 Dyshidrosis [pompholyx]: Secondary | ICD-10-CM | POA: Diagnosis not present

## 2019-03-04 ENCOUNTER — Ambulatory Visit (INDEPENDENT_AMBULATORY_CARE_PROVIDER_SITE_OTHER): Payer: BC Managed Care – PPO | Admitting: Family Medicine

## 2019-03-04 ENCOUNTER — Other Ambulatory Visit: Payer: Self-pay

## 2019-03-04 ENCOUNTER — Encounter: Payer: Self-pay | Admitting: Family Medicine

## 2019-03-04 VITALS — BP 110/70 | HR 86 | Temp 98.2°F | Resp 16 | Ht 67.0 in | Wt 195.0 lb

## 2019-03-04 DIAGNOSIS — F410 Panic disorder [episodic paroxysmal anxiety] without agoraphobia: Secondary | ICD-10-CM

## 2019-03-04 MED ORDER — ESCITALOPRAM OXALATE 10 MG PO TABS: 10.0000 mg | ORAL_TABLET | Freq: Every day | ORAL | 2 refills | Status: DC

## 2019-03-04 MED ORDER — ALPRAZOLAM 0.5 MG PO TABS: 0.5000 mg | ORAL_TABLET | Freq: Three times a day (TID) | ORAL | 0 refills | Status: DC | PRN
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
## 2019-03-26 DIAGNOSIS — Z118 Encounter for screening for other infectious and parasitic diseases: Secondary | ICD-10-CM | POA: Diagnosis not present

## 2019-03-26 DIAGNOSIS — Z3200 Encounter for pregnancy test, result unknown: Secondary | ICD-10-CM | POA: Diagnosis not present

## 2019-03-26 DIAGNOSIS — N921 Excessive and frequent menstruation with irregular cycle: Secondary | ICD-10-CM | POA: Diagnosis not present

## 2019-03-26 DIAGNOSIS — Z7251 High risk heterosexual behavior: Secondary | ICD-10-CM | POA: Diagnosis not present

## 2019-04-01 ENCOUNTER — Ambulatory Visit: Payer: BC Managed Care – PPO | Admitting: Family Medicine

## 2019-04-02 DIAGNOSIS — R899 Unspecified abnormal finding in specimens from other organs, systems and tissues: Secondary | ICD-10-CM | POA: Diagnosis not present

## 2019-04-08 ENCOUNTER — Encounter: Payer: Self-pay | Admitting: Family Medicine

## 2019-04-08 ENCOUNTER — Other Ambulatory Visit: Payer: Self-pay

## 2019-04-08 ENCOUNTER — Ambulatory Visit (INDEPENDENT_AMBULATORY_CARE_PROVIDER_SITE_OTHER): Payer: BC Managed Care – PPO | Admitting: Family Medicine

## 2019-04-08 VITALS — BP 122/78 | HR 72 | Temp 97.5°F | Resp 16 | Ht 67.0 in | Wt 201.0 lb

## 2019-04-08 DIAGNOSIS — R7989 Other specified abnormal findings of blood chemistry: Secondary | ICD-10-CM

## 2019-04-08 DIAGNOSIS — R946 Abnormal results of thyroid function studies: Secondary | ICD-10-CM | POA: Diagnosis not present
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
## 2019-04-09 ENCOUNTER — Encounter: Payer: Self-pay | Admitting: Family Medicine

## 2019-04-09 DIAGNOSIS — E039 Hypothyroidism, unspecified: Secondary | ICD-10-CM | POA: Insufficient documentation

## 2019-04-09 DIAGNOSIS — E038 Other specified hypothyroidism: Secondary | ICD-10-CM | POA: Insufficient documentation

## 2019-04-09 LAB — THYROID PEROXIDASE ANTIBODIES (TPO) (REFL): Thyroperoxidase Ab SerPl-aCnc: 2 IU/mL (ref <9)

## 2019-04-09 LAB — TSH: TSH: 4.73 mIU/L — ABNORMAL HIGH

## 2019-04-09 LAB — T3, FREE: T3, Free: 3.3 pg/mL (ref 2.3–4.2)

## 2019-04-09 LAB — T4, FREE: Free T4: 0.9 ng/dL (ref 0.8–1.8)

## 2019-04-16 DIAGNOSIS — Z118 Encounter for screening for other infectious and parasitic diseases: Secondary | ICD-10-CM | POA: Diagnosis not present

## 2019-04-16 DIAGNOSIS — Z3200 Encounter for pregnancy test, result unknown: Secondary | ICD-10-CM | POA: Diagnosis not present

## 2019-04-16 DIAGNOSIS — Z113 Encounter for screening for infections with a predominantly sexual mode of transmission: Secondary | ICD-10-CM | POA: Diagnosis not present

## 2019-05-20 ENCOUNTER — Other Ambulatory Visit: Payer: Self-pay | Admitting: Family Medicine

## 2019-05-20 MED ORDER — ALPRAZOLAM 0.5 MG PO TABS: 0.5000 mg | ORAL_TABLET | Freq: Three times a day (TID) | ORAL | 0 refills | Status: DC | PRN

## 2019-05-20 NOTE — Telephone Encounter (Signed)
Pt is requesting refill on Xanax   LOV: 04/05/19  LRF:   03/04/19

## 2019-05-20 NOTE — Telephone Encounter (Signed)
Patient is asking for refill on her xanax  [facility]

## 2019-07-12 ENCOUNTER — Encounter: Payer: Self-pay | Admitting: Family Medicine

## 2019-09-26 DIAGNOSIS — N760 Acute vaginitis: Secondary | ICD-10-CM | POA: Diagnosis not present

## 2019-09-26 DIAGNOSIS — R300 Dysuria: Secondary | ICD-10-CM | POA: Diagnosis not present

## 2019-10-23 ENCOUNTER — Other Ambulatory Visit: Payer: Self-pay | Admitting: Family Medicine

## 2019-10-23 NOTE — Telephone Encounter (Signed)
Pt is requesting refill on Xanax   LOV: 04/08/2019  LRF:   05/20/2019

## 2019-10-23 NOTE — Telephone Encounter (Signed)
CB#[phone] Refill Xanax

## 2019-10-24 MED ORDER — ALPRAZOLAM 0.5 MG PO TABS: 0.5000 mg | ORAL_TABLET | Freq: Three times a day (TID) | ORAL | 0 refills | Status: DC | PRN

## 2020-04-13 ENCOUNTER — Other Ambulatory Visit: Payer: Self-pay

## 2020-04-13 MED ORDER — ALPRAZOLAM 0.5 MG PO TABS: 0.5000 mg | ORAL_TABLET | Freq: Three times a day (TID) | ORAL | 0 refills | Status: DC | PRN

## 2020-08-07 DIAGNOSIS — R87612 Low grade squamous intraepithelial lesion on cytologic smear of cervix (LGSIL): Secondary | ICD-10-CM | POA: Insufficient documentation

## 2020-08-07 DIAGNOSIS — N920 Excessive and frequent menstruation with regular cycle: Secondary | ICD-10-CM | POA: Insufficient documentation

## 2020-08-07 DIAGNOSIS — A6000 Herpesviral infection of urogenital system, unspecified: Secondary | ICD-10-CM | POA: Insufficient documentation

## 2020-08-12 DIAGNOSIS — Z609 Problem related to social environment, unspecified: Secondary | ICD-10-CM | POA: Diagnosis not present

## 2020-08-12 DIAGNOSIS — Z6838 Body mass index (BMI) 38.0-38.9, adult: Secondary | ICD-10-CM | POA: Diagnosis not present

## 2020-08-12 DIAGNOSIS — Z01419 Encounter for gynecological examination (general) (routine) without abnormal findings: Secondary | ICD-10-CM | POA: Diagnosis not present

## 2020-08-12 DIAGNOSIS — R87612 Low grade squamous intraepithelial lesion on cytologic smear of cervix (LGSIL): Secondary | ICD-10-CM | POA: Diagnosis not present

## 2020-09-18 ENCOUNTER — Other Ambulatory Visit: Payer: Self-pay

## 2020-09-18 ENCOUNTER — Inpatient Hospital Stay (HOSPITAL_COMMUNITY)
Admission: AD | Admit: 2020-09-18 | Discharge: 2020-09-18 | Disposition: A | Discharge status: Home / Self Care | Payer: BC Managed Care – PPO | Attending: Obstetrics & Gynecology | Admitting: Obstetrics & Gynecology

## 2020-09-18 ENCOUNTER — Encounter (HOSPITAL_COMMUNITY): Payer: Self-pay | Admitting: Obstetrics & Gynecology

## 2020-09-18 DIAGNOSIS — Z79899 Other long term (current) drug therapy: Secondary | ICD-10-CM | POA: Insufficient documentation

## 2020-09-18 DIAGNOSIS — O219 Vomiting of pregnancy, unspecified: Secondary | ICD-10-CM | POA: Diagnosis not present

## 2020-09-18 DIAGNOSIS — Z3A12 12 weeks gestation of pregnancy: Secondary | ICD-10-CM | POA: Insufficient documentation

## 2020-09-18 DIAGNOSIS — O210 Mild hyperemesis gravidarum: Secondary | ICD-10-CM

## 2020-09-18 HISTORY — DX: Anxiety disorder, unspecified: F41.9

## 2020-09-18 HISTORY — DX: Anemia, unspecified: D64.9

## 2020-09-18 LAB — URINALYSIS, ROUTINE W REFLEX MICROSCOPIC
Bilirubin Urine: NEGATIVE
Glucose, UA: NEGATIVE mg/dL
Hgb urine dipstick: NEGATIVE
Ketones, ur: NEGATIVE mg/dL
Leukocytes,Ua: NEGATIVE
Nitrite: NEGATIVE
Protein, ur: NEGATIVE mg/dL
Specific Gravity, Urine: 1.024 (ref 1.005–1.030)
pH: 7 (ref 5.0–8.0)

## 2020-09-18 LAB — POCT PREGNANCY, URINE: Preg Test, Ur: POSITIVE — AB

## 2020-09-18 MED ORDER — SODIUM CHLORIDE 0.9 % IV SOLN
8.0000 mg | Freq: Once | INTRAVENOUS | Status: AC
  Administered 2020-09-18: 8 mg via INTRAVENOUS
  Filled 2020-09-18: qty 4

## 2020-09-18 MED ORDER — ONDANSETRON 8 MG PO TBDP: 8.0000 mg | ORAL_TABLET | Freq: Three times a day (TID) | ORAL | 1 refills | Status: DC | PRN

## 2020-09-18 MED ORDER — LACTATED RINGERS IV SOLN: Freq: Once | INTRAVENOUS | Status: AC

## 2020-09-18 NOTE — MAU Note (Signed)
Has been throwing up for 2 days straight, can't hold anything fluids or anything down. Was shaking last night. Denies fever, diarrhea or constipation. No pain. No bleeding. Has not been seen yet with preg.  +HPT in Feb.

## 2020-09-18 NOTE — MAU Provider Note (Signed)
History     CSN: [id number]  Arrival date and time: 09/18/20 1351   Event Date/Time   First Provider Initiated Contact with Patient 09/18/20 1700      Chief Complaint  Patient presents with  . Emesis  . Nausea   HPI  [name] is a 25 y.o. female G2P1001 @ [redacted]w[redacted]d here in MAU nausea and vomiting. She has not been able to keep much down for 2 days. She started care with [facility], unsure if she will stay there d/t insurance. She reports keeping some crackers down today and a few sips of water. She is not taking anything for the symptoms. Symptoms have worsened the last 2 days.   OB History    Gravida  2   Para  1   Term  1   Preterm      AB      Living  1     SAB      IAB      Ectopic      Multiple  0   Live Births  1        Obstetric Comments  C/s ? Dilation, or descent        Past Medical History:  Diagnosis Date  . ADHD (attention deficit hyperactivity disorder)   . Anemia   . Anxiety   . Hx of chlamydia infection   . Hx of gonorrhea   . Subclinical hypothyroidism     Past Surgical History:  Procedure Laterality Date  . CESAREAN SECTION N/A 03/09/2016   Procedure: CESAREAN SECTION;  Surgeon: Olivia Mackie, MD;  Location: [facility] BIRTHING SUITES;  Service: Obstetrics;  Laterality: N/A;    Family History  Problem Relation Age of Onset  . Hypertension Mother   . Cancer Father        Hodgkin's Lymphoma  . Heart disease Father   . Hypertension Father   . Diabetes Maternal Aunt   . Asthma Maternal Grandmother   . Diabetes Maternal Grandmother   . Miscarriages / Stillbirths Maternal Grandmother   . Alcohol abuse Neg Hx   . Arthritis Neg Hx   . Birth defects Neg Hx   . COPD Neg Hx   . Depression Neg Hx   . Drug abuse Neg Hx   . Early death Neg Hx   . Hearing loss Neg Hx   . Hyperlipidemia Neg Hx   . Kidney disease Neg Hx   . Learning disabilities Neg Hx   . Mental illness Neg Hx   . Mental retardation Neg Hx   . Stroke Neg Hx    . Vision loss Neg Hx   . Varicose Veins Neg Hx     Social History   Tobacco Use  . Smoking status: Never Smoker  . Smokeless tobacco: Never Used  Vaping Use  . Vaping Use: Never used  Substance Use Topics  . Alcohol use: No    Alcohol/week: 0.0 standard drinks  . Drug use: No    Allergies: No Known Allergies  Medications Prior to Admission  Medication Sig Dispense Refill Last Dose  . Prenatal Vit-Fe Fumarate-FA (PRENATAL VITAMIN PO) Take by mouth.   09/17/2020 at Unknown time  . ALPRAZolam (XANAX) 0.5 MG tablet Take 1 tablet (0.5 mg total) by mouth 3 (three) times daily as needed for anxiety. 30 tablet 0   . escitalopram (LEXAPRO) 10 MG tablet Take 1 tablet (10 mg total) by mouth daily. (Patient not taking: No sig reported) 30 tablet 2   .  valACYclovir (VALTREX) 500 MG tablet TAKE 1 TABLET BY MOUTH TWICE A DAY FOR 3 TO 5 DAYS WITH OUTBREAK  12    Results for orders placed or performed during the hospital encounter of 09/18/20 (from the past 48 hour(s))  Urinalysis, Routine w reflex microscopic     Status: Abnormal   Collection Time: 09/18/20  2:26 PM  Result Value Ref Range   Color, Urine YELLOW YELLOW   APPearance HAZY (A) CLEAR   Specific Gravity, Urine 1.024 1.005 - 1.030   pH 7.0 5.0 - 8.0   Glucose, UA NEGATIVE NEGATIVE mg/dL   Hgb urine dipstick NEGATIVE NEGATIVE   Bilirubin Urine NEGATIVE NEGATIVE   Ketones, ur NEGATIVE NEGATIVE mg/dL   Protein, ur NEGATIVE NEGATIVE mg/dL   Nitrite NEGATIVE NEGATIVE   Leukocytes,Ua NEGATIVE NEGATIVE    Comment: Performed at [facility], [address] [place], [place] [address]  Pregnancy, urine POC     Status: Abnormal   Collection Time: 09/18/20  2:26 PM  Result Value Ref Range   Preg Test, Ur POSITIVE (A) NEGATIVE    Comment:        THE SENSITIVITY OF THIS METHODOLOGY IS >24 mIU/mL    Review of Systems  Gastrointestinal: Positive for nausea and vomiting. Negative for abdominal pain.  Genitourinary: Negative for  dysuria and vaginal bleeding.   Physical Exam   Blood pressure 129/74, pulse (!) 101, temperature 98.3 F (36.8 C), temperature source Oral, resp. rate 20, height 5\' 7"  (1.702 m), weight 112.1 kg, last menstrual period 06/25/2020, SpO2 99 %, unknown if currently breastfeeding.  Physical Exam Constitutional:      General: She is not in acute distress.    Appearance: Normal appearance. She is not ill-appearing, toxic-appearing or diaphoretic.  HENT:     Head: Normocephalic.  Musculoskeletal:        General: Normal range of motion.  Skin:    General: Skin is warm.  Neurological:     Mental Status: She is alert and oriented to person, place, and time.  Psychiatric:        Behavior: Behavior normal.    MAU Course  Procedures   Pt informed that the ultrasound is considered a limited OB ultrasound and is not intended to be a complete ultrasound exam.  Patient also informed that the ultrasound is not being completed with the intent of assessing for fetal or placental anomalies or any pelvic abnormalities.  Explained that the purpose of today's ultrasound is to assess for  viability.  Patient acknowledges the purpose of the exam and the limitations of the study.  Active fetus on 06/27/2020 with + fetal HRT   MDM  Mild dehydration Lr bolus x 1 Zofran 8 mg x 1 Patient tolerating oral fluids, no vomiting in MAU  Assessment and Plan   A:  1. Nausea and vomiting in pregnancy   2. [redacted] weeks gestation of pregnancy     P:  Discharge home in stable condition Return to MAU if symptoms worsen [facility] office info given [facility] Small frequent meals.   SF:KCLEXN, NP 09/18/2020 6:37 PM

## 2020-09-18 NOTE — Discharge Instructions (Signed)
Nausea and Vomiting, Adult Nausea is feeling sick to your stomach or feeling that you are about to throw up (vomit). Vomiting is when food in your stomach is thrown up and out of the mouth. Throwing up can make you feel weak. It can also make you lose too much water in your body (get dehydrated). If you lose too much water in your body, you may:  Feel tired.  Feel thirsty.  Have a dry mouth.  Have cracked lips.  Go pee (urinate) less often. Older adults and people with other diseases or a weak body defense system (immune system) are at higher risk for losing too much water in the body. If you feel sick to your stomach and you throw up, it is important to follow instructions from your doctor about how to take care of yourself. Follow these instructions at home: Watch your symptoms for any changes. Tell your doctor about them. Follow these instructions to care for yourself at home. Eating and drinking  Take an ORS (oral rehydration solution). This is a drink that is sold at pharmacies and stores.  Drink clear fluids in small amounts as you are able, such as: ? Water. ? Ice chips. ? Fruit juice that has water added (diluted fruit juice). ? Low-calorie sports drinks.  Eat bland, easy-to-digest foods in small amounts as you are able, such as: ? Bananas. ? Applesauce. ? Rice. ? Low-fat (lean) meats. ? Toast. ? Crackers.  Avoid drinking fluids that have a lot of sugar or caffeine in them. This includes energy drinks, sports drinks, and soda.  Avoid alcohol.  Avoid spicy or fatty foods.      General instructions  Take over-the-counter and prescription medicines only as told by your doctor.  Drink enough fluid to keep your pee (urine) pale yellow.  Wash your hands often with soap and water. If you cannot use soap and water, use hand sanitizer.  Make sure that all people in your home wash their hands well and often.  Rest at home while you get better.  Watch your condition  for any changes.  Take slow and deep breaths when you feel sick to your stomach.  Keep all follow-up visits as told by your doctor. This is important. Contact a doctor if:  Your symptoms get worse.  You have new symptoms.  You have a fever.  You cannot drink fluids without throwing up.  You feel sick to your stomach for more than 2 days.  You feel light-headed or dizzy.  You have a headache.  You have muscle cramps.  You have a rash.  You have pain while peeing. Get help right away if:  You have pain in your chest, neck, arm, or jaw.  You feel very weak or you pass out (faint).  You throw up again and again.  You have throw up that is bright red or looks like black coffee grounds.  You have bloody or black poop (stools) or poop that looks like tar.  You have a very bad headache, a stiff neck, or both.  You have very bad pain, cramping, or bloating in your belly (abdomen).  You have trouble breathing.  You are breathing very quickly.  Your heart is beating very quickly.  Your skin feels cold and clammy.  You feel confused.  You have signs of losing too much water in your body, such as: ? Dark pee, very little pee, or no pee. ? Cracked lips. ? Dry mouth. ? Sunken eyes. ?   Sleepiness. ? Weakness. These symptoms may be an emergency. Do not wait to see if the symptoms will go away. Get medical help right away. Call your local emergency services (911 in the [place] Do not drive yourself to the hospital. Summary  Nausea is feeling sick to your stomach or feeling that you are about to throw up (vomit). Vomiting is when food in your stomach is thrown up and out of the mouth.  Follow instructions from your doctor about eating and drinking to keep from losing too much water in your body.  Take over-the-counter and prescription medicines only as told by your doctor.  Contact your doctor if your symptoms get worse or you have new symptoms.  Keep all follow-up  visits as told by your doctor. This is important. This information is not intended to replace advice given to you by your health care provider. Make sure you discuss any questions you have with your health care provider. Document Revised: 09/21/2018 Document Reviewed: 11/07/2017 Elsevier Patient Education  2021 Elsevier Inc.  

## 2020-11-20 DIAGNOSIS — Z3480 Encounter for supervision of other normal pregnancy, unspecified trimester: Secondary | ICD-10-CM | POA: Insufficient documentation

## 2020-11-24 DIAGNOSIS — N898 Other specified noninflammatory disorders of vagina: Secondary | ICD-10-CM | POA: Diagnosis not present

## 2020-11-24 DIAGNOSIS — Z363 Encounter for antenatal screening for malformations: Secondary | ICD-10-CM | POA: Diagnosis not present

## 2020-11-24 DIAGNOSIS — Z3482 Encounter for supervision of other normal pregnancy, second trimester: Secondary | ICD-10-CM | POA: Diagnosis not present

## 2020-11-24 DIAGNOSIS — Z3689 Encounter for other specified antenatal screening: Secondary | ICD-10-CM | POA: Diagnosis not present

## 2020-11-24 LAB — OB RESULTS CONSOLE HIV ANTIBODY (ROUTINE TESTING): HIV: NONREACTIVE

## 2020-11-24 LAB — OB RESULTS CONSOLE ABO/RH: RH Type: POSITIVE

## 2020-11-24 LAB — OB RESULTS CONSOLE GC/CHLAMYDIA
Chlamydia: NEGATIVE
Gonorrhea: NEGATIVE

## 2020-11-24 LAB — OB RESULTS CONSOLE RPR: RPR: NONREACTIVE

## 2020-11-24 LAB — OB RESULTS CONSOLE ANTIBODY SCREEN: Antibody Screen: NEGATIVE

## 2020-11-24 LAB — OB RESULTS CONSOLE HEPATITIS B SURFACE ANTIGEN: Hepatitis B Surface Ag: NEGATIVE

## 2020-11-24 LAB — OB RESULTS CONSOLE RUBELLA ANTIBODY, IGM: Rubella: IMMUNE

## 2021-01-06 DIAGNOSIS — Z3689 Encounter for other specified antenatal screening: Secondary | ICD-10-CM | POA: Diagnosis not present

## 2021-01-20 DIAGNOSIS — Z3689 Encounter for other specified antenatal screening: Secondary | ICD-10-CM | POA: Diagnosis not present

## 2021-02-02 ENCOUNTER — Encounter (HOSPITAL_COMMUNITY): Payer: Self-pay

## 2021-02-02 ENCOUNTER — Emergency Department (HOSPITAL_COMMUNITY): Payer: BC Managed Care – PPO

## 2021-02-02 ENCOUNTER — Emergency Department (HOSPITAL_COMMUNITY)
Admission: EM | Admit: 2021-02-02 | Discharge: 2021-02-03 | Disposition: A | Discharge status: Home / Self Care | Payer: BC Managed Care – PPO | Attending: Emergency Medicine | Admitting: Emergency Medicine

## 2021-02-02 ENCOUNTER — Other Ambulatory Visit: Payer: Self-pay

## 2021-02-02 DIAGNOSIS — R000 Tachycardia, unspecified: Secondary | ICD-10-CM | POA: Insufficient documentation

## 2021-02-02 DIAGNOSIS — R519 Headache, unspecified: Secondary | ICD-10-CM

## 2021-02-02 DIAGNOSIS — O219 Vomiting of pregnancy, unspecified: Secondary | ICD-10-CM | POA: Insufficient documentation

## 2021-02-02 DIAGNOSIS — E039 Hypothyroidism, unspecified: Secondary | ICD-10-CM | POA: Insufficient documentation

## 2021-02-02 DIAGNOSIS — Z79899 Other long term (current) drug therapy: Secondary | ICD-10-CM | POA: Insufficient documentation

## 2021-02-02 DIAGNOSIS — Z3A00 Weeks of gestation of pregnancy not specified: Secondary | ICD-10-CM | POA: Diagnosis not present

## 2021-02-02 DIAGNOSIS — U071 COVID-19: Secondary | ICD-10-CM | POA: Diagnosis not present

## 2021-02-02 DIAGNOSIS — R0602 Shortness of breath: Secondary | ICD-10-CM | POA: Diagnosis not present

## 2021-02-02 DIAGNOSIS — Y900 Blood alcohol level of less than 20 mg/100 ml: Secondary | ICD-10-CM | POA: Insufficient documentation

## 2021-02-02 DIAGNOSIS — Z3A32 32 weeks gestation of pregnancy: Secondary | ICD-10-CM | POA: Diagnosis not present

## 2021-02-02 DIAGNOSIS — O98519 Other viral diseases complicating pregnancy, unspecified trimester: Secondary | ICD-10-CM | POA: Insufficient documentation

## 2021-02-02 DIAGNOSIS — R4182 Altered mental status, unspecified: Secondary | ICD-10-CM | POA: Diagnosis not present

## 2021-02-02 DIAGNOSIS — O99280 Endocrine, nutritional and metabolic diseases complicating pregnancy, unspecified trimester: Secondary | ICD-10-CM | POA: Insufficient documentation

## 2021-02-02 DIAGNOSIS — O99891 Other specified diseases and conditions complicating pregnancy: Secondary | ICD-10-CM | POA: Diagnosis not present

## 2021-02-02 LAB — CBC WITH DIFFERENTIAL/PLATELET
Abs Immature Granulocytes: 0.06 10*3/uL (ref 0.00–0.07)
Basophils Absolute: 0 10*3/uL (ref 0.0–0.1)
Basophils Relative: 0 %
Eosinophils Absolute: 0.1 10*3/uL (ref 0.0–0.5)
Eosinophils Relative: 2 %
HCT: 34.6 % — ABNORMAL LOW (ref 36.0–46.0)
Hemoglobin: 11.3 g/dL — ABNORMAL LOW (ref 12.0–15.0)
Immature Granulocytes: 1 %
Lymphocytes Relative: 5 %
Lymphs Abs: 0.2 10*3/uL — ABNORMAL LOW (ref 0.7–4.0)
MCH: 29.8 pg (ref 26.0–34.0)
MCHC: 32.7 g/dL (ref 30.0–36.0)
MCV: 91.3 fL (ref 80.0–100.0)
Monocytes Absolute: 0.8 10*3/uL (ref 0.1–1.0)
Monocytes Relative: 15 %
Neutro Abs: 3.9 10*3/uL (ref 1.7–7.7)
Neutrophils Relative %: 77 %
Platelets: 155 10*3/uL (ref 150–400)
RBC: 3.79 MIL/uL — ABNORMAL LOW (ref 3.87–5.11)
RDW: 13.2 % (ref 11.5–15.5)
WBC: 5.1 10*3/uL (ref 4.0–10.5)
nRBC: 0 % (ref 0.0–0.2)

## 2021-02-02 LAB — URINALYSIS, ROUTINE W REFLEX MICROSCOPIC
Bilirubin Urine: NEGATIVE
Glucose, UA: NEGATIVE mg/dL
Hgb urine dipstick: NEGATIVE
Ketones, ur: 5 mg/dL — AB
Leukocytes,Ua: NEGATIVE
Nitrite: NEGATIVE
Protein, ur: NEGATIVE mg/dL
Specific Gravity, Urine: 1.008 (ref 1.005–1.030)
pH: 9 — ABNORMAL HIGH (ref 5.0–8.0)

## 2021-02-02 LAB — COMPREHENSIVE METABOLIC PANEL
ALT: 16 U/L (ref 0–44)
AST: 21 U/L (ref 15–41)
Albumin: 3.1 g/dL — ABNORMAL LOW (ref 3.5–5.0)
Alkaline Phosphatase: 76 U/L (ref 38–126)
Anion gap: 8 (ref 5–15)
BUN: 7 mg/dL (ref 6–20)
CO2: 22 mmol/L (ref 22–32)
Calcium: 9.1 mg/dL (ref 8.9–10.3)
Chloride: 106 mmol/L (ref 98–111)
Creatinine, Ser: 0.55 mg/dL (ref 0.44–1.00)
GFR, Estimated: >60 mL/min (ref >60)
Glucose, Bld: 93 mg/dL (ref 70–99)
Potassium: 3.4 mmol/L — ABNORMAL LOW (ref 3.5–5.1)
Sodium: 136 mmol/L (ref 135–145)
Total Bilirubin: 0.5 mg/dL (ref 0.3–1.2)
Total Protein: 6.2 g/dL — ABNORMAL LOW (ref 6.5–8.1)

## 2021-02-02 LAB — ETHANOL: Alcohol, Ethyl (B): <10 mg/dL (ref <10)

## 2021-02-02 LAB — LIPASE, BLOOD: Lipase: 23 U/L (ref 11–51)

## 2021-02-02 LAB — LACTIC ACID, PLASMA: Lactic Acid, Venous: 1.4 mmol/L (ref 0.5–1.9)

## 2021-02-02 MED ORDER — ONDANSETRON HCL 4 MG/2ML IJ SOLN
4.0000 mg | Freq: Once | INTRAMUSCULAR | Status: AC
  Administered 2021-02-02: 4 mg via INTRAVENOUS
  Filled 2021-02-02: qty 2

## 2021-02-02 MED ORDER — LACTATED RINGERS IV BOLUS: 1000.0000 mL | Freq: Once | INTRAVENOUS | Status: DC

## 2021-02-02 MED ORDER — KCL IN DEXTROSE-NACL 20-5-0.45 MEQ/L-%-% IV SOLN
Freq: Once | INTRAVENOUS | Status: DC
  Filled 2021-02-02: qty 1000

## 2021-02-02 MED ORDER — LACTATED RINGERS IV BOLUS
1000.0000 mL | Freq: Once | INTRAVENOUS | Status: AC
  Administered 2021-02-02: 1000 mL via INTRAVENOUS

## 2021-02-02 MED ORDER — LACTATED RINGERS IV BOLUS
1000.0000 mL | Freq: Once | INTRAVENOUS | Status: AC
  Administered 2021-02-03: 1000 mL via INTRAVENOUS

## 2021-02-02 MED ORDER — ACETAMINOPHEN 500 MG PO TABS
1000.0000 mg | ORAL_TABLET | Freq: Once | ORAL | Status: AC
  Administered 2021-02-02: 1000 mg via ORAL
  Filled 2021-02-02: qty 2

## 2021-02-02 NOTE — ED Notes (Signed)
Pt cleared by rapid OB

## 2021-02-02 NOTE — Progress Notes (Signed)
OB Rapid Nurse called by ED nurse at 2120 reporting a 32 week G2P1 pregnant patient complaining of confusion and headache.  Upon arrival to bedside at 2138, patient is alert and oriented x4, patient is anxious, vitals are:  BP: 129/58 HR: 123 SPO2: 96-100 RR: 12, unlabored breathing.  Reports headache (denies taking medication for relief), denies right upper quadrant pain, denies leaking of fluid, denies any abdominal or back pain, denies vision changes, Negative for clonus, reflexes evaluated as plus 1, affirms fetal movement. Category I FHR tracing with no uterine contractions. Baseline HR 155 with 15x15 accelerations and no decelerations.   Dr. Billy Coast was notified at 2200 of patient vitals,patient status and fetal status, provider proceeded to clear patient from [facility] standpoint.

## 2021-02-02 NOTE — ED Provider Notes (Signed)
[facility] EMERGENCY DEPARTMENT Provider Note   CSN: [id number] Arrival date & time: 02/02/21  2104     History Chief Complaint  Patient presents with   Altered Mental Status   Headache   Shortness of Breath    Taylor Rios is a 25 y.o. female.  HPI Patient presents with her mother who provides much of the history. Patient arrives after having been seen earlier in the day at urgent care, then with her OB team.  She presents now due to ongoing nausea, dyspnea, headache.  Patient is approximately 7 months into pregnancy, reportedly unremarkable.  Today she developed anxiousness, dyspnea, went to urgent care was prescribed bronchodilator.  With concern for pregnancy she saw her OB team soon thereafter, had confirmation that medicine was appropriate.  She was also prescribed Claritin.  She notes that upon arriving home she took a dose Claritin, and soon thereafter had sudden onset nausea, vomiting.  Nausea and vomiting lasted for some time and the patient felt lightheaded, cloudy, to use her words, without syncope, without overt confusion. According patient and her mother speech is currently clear, patient is oriented appropriately, and she notes that her nausea, anxiety have both improved somewhat. With concern for headache, flushed face, ongoing nausea, vomiting, the patient was brought here by her mother for evaluation. After initial evaluation I discussed her case with our OB team, rapid response was at bedside, and that nurse discussed the patient's presentation with her OB doctor who indicated the patient should stay in the adult ED for care.    Past Medical History:  Diagnosis Date   ADHD (attention deficit hyperactivity disorder)    Anemia    Anxiety    Hx of chlamydia infection    Hx of gonorrhea    Subclinical hypothyroidism     Patient Active Problem List   Diagnosis Date Noted   Subclinical hypothyroidism    Postpartum care following cesarean  delivery (9/27) 03/10/2016   Acute blood loss anemia 03/10/2016   Post term pregnancy over 40 weeks 03/09/2016    Past Surgical History:  Procedure Laterality Date   CESAREAN SECTION N/A 03/09/2016   Procedure: CESAREAN SECTION;  Surgeon: Olivia Mackie, MD;  Location: [facility] BIRTHING SUITES;  Service: Obstetrics;  Laterality: N/A;     OB History     Gravida  2   Para  1   Term  1   Preterm      AB      Living  1      SAB      IAB      Ectopic      Multiple  0   Live Births  1        Obstetric Comments  C/s ? Dilation, or descent         Family History  Problem Relation Age of Onset   Hypertension Mother    Cancer Father        Hodgkin's Lymphoma   Heart disease Father    Hypertension Father    Diabetes Maternal Aunt    Asthma Maternal Grandmother    Diabetes Maternal Grandmother    Miscarriages / Stillbirths Maternal Grandmother    Alcohol abuse Neg Hx    Arthritis Neg Hx    Birth defects Neg Hx    COPD Neg Hx    Depression Neg Hx    Drug abuse Neg Hx    Early death Neg Hx    Hearing loss Neg Hx  Hyperlipidemia Neg Hx    Kidney disease Neg Hx    Learning disabilities Neg Hx    Mental illness Neg Hx    Mental retardation Neg Hx    Stroke Neg Hx    Vision loss Neg Hx    Varicose Veins Neg Hx     Social History   Tobacco Use   Smoking status: Never   Smokeless tobacco: Never  Vaping Use   Vaping Use: Never used  Substance Use Topics   Alcohol use: No    Alcohol/week: 0.0 standard drinks   Drug use: No    Home Medications Prior to Admission medications   Medication Sig Start Date End Date Taking? Authorizing Provider  escitalopram (LEXAPRO) 10 MG tablet Take 1 tablet (10 mg total) by mouth daily. Patient not taking: No sig reported 03/04/19   Donita Brooks, MD  ondansetron (ZOFRAN ODT) 8 MG disintegrating tablet Take 1 tablet (8 mg total) by mouth every 8 (eight) hours as needed for nausea or vomiting. 09/18/20   Rasch, Harolyn Rutherford, NP  Prenatal Vit-Fe Fumarate-FA (PRENATAL VITAMIN PO) Take by mouth.    [provider]  valACYclovir (VALTREX) 500 MG tablet TAKE 1 TABLET BY MOUTH TWICE A DAY FOR 3 TO 5 DAYS WITH OUTBREAK 04/03/18   [provider]    Allergies    Patient has no known allergies.  Review of Systems   Review of Systems  Constitutional:        Per HPI, otherwise negative  HENT:         Per HPI, otherwise negative  Respiratory:         Per HPI, otherwise negative  Cardiovascular:        Per HPI, otherwise negative  Gastrointestinal:  Positive for nausea and vomiting. Negative for abdominal pain.  Endocrine:       Negative aside from HPI  Genitourinary:        Neg aside from HPI   Musculoskeletal:        Per HPI, otherwise negative  Skin: Negative.   Neurological:  Positive for headaches. Negative for syncope.   Physical Exam Updated Vital Signs BP (!) 129/58 (BP Location: Left Arm)   Pulse (!) 123   Temp 98.9 F (37.2 C)   Resp 12   Ht 5\' 7"  (1.702 m)   Wt 127 kg   LMP 06/25/2020   SpO2 96%   BMI 43.85 kg/m   Physical Exam Vitals and nursing note reviewed.  Constitutional:      General: She is not in acute distress.    Appearance: She is well-developed.  HENT:     Head: Normocephalic and atraumatic.     Comments: Flushed face otherwise unremarkable Eyes:     Conjunctiva/sclera: Conjunctivae normal.  Cardiovascular:     Rate and Rhythm: Regular rhythm. Tachycardia present.  Pulmonary:     Effort: Pulmonary effort is normal. No respiratory distress.     Breath sounds: Normal breath sounds. No stridor.  Abdominal:     General: There is no distension.     Tenderness: There is no abdominal tenderness.     Comments: Gravid abdomen, nontender, no guarding, no rebound  Skin:    General: Skin is warm and dry.  Neurological:     Mental Status: She is alert and oriented to person, place, and time.     Cranial Nerves: No cranial nerve deficit.    ED Results  / Procedures / Treatments   Labs (  all labs ordered are listed, but only abnormal results are displayed) Labs Reviewed  CBC WITH DIFFERENTIAL/PLATELET - Abnormal; Notable for the following components:      Result Value   RBC 3.79 (*)    Hemoglobin 11.3 (*)    HCT 34.6 (*)    Lymphs Abs 0.2 (*)    All other components within normal limits  SARS CORONAVIRUS 2 (TAT 6-24 HRS)  COMPREHENSIVE METABOLIC PANEL  LACTIC ACID, PLASMA  LACTIC ACID, PLASMA  LIPASE, BLOOD  URINALYSIS, ROUTINE W REFLEX MICROSCOPIC  RAPID URINE DRUG SCREEN, HOSP PERFORMED  ETHANOL    EKG EKG Interpretation  Date/Time:  Tuesday February 02 2021 21:06:24 EDT Ventricular Rate:  128 PR Interval:  148 QRS Duration: 76 QT Interval:  296 QTC Calculation: 432 R Axis:   108 Text Interpretation: Sinus tachycardia Rightward axis Borderline ECG Abnormal ECG Confirmed by Gerhard Munch [id number]) on 02/02/2021 9:23:44 PM  Radiology No results found.  Procedures Procedures   Medications Ordered in ED Medications  lactated ringers bolus 1,000 mL (1,000 mLs Intravenous New Bag/Given 02/02/21 2214)    ED Course  I have reviewed the triage vital signs and the nursing notes.  Pertinent labs & imaging results that were available during my care of the patient were reviewed by me and considered in my medical decision making (see chart for details).  Clinical Course as of 02/03/21 0001  Tue Feb 02, 2021  2113 Called charge RN to request a room.  [EH]  2119 Called CT scan to expedite scan.  [EH]    Clinical Course User Index [EH] Cristina Gong, PA-C  [age]F arrival patient was placed to trauma room, placed on continuous cardiac, and tocometry monitors.  Cardiac 120 sinus tach abnormal Tocometry unremarkable. MDM Rules/Calculators/A&P 12:01 AM No additional vomiting since arrival patient is awake, alert, speaking clearly.  She notes that she continues to feel poorly in general, with mild headache.  She is not  hypoxic, complains of no dyspnea, has minimal tachypnea, lower suspicion for PE, though this remains on the differential, though low. COVID test pending, urinalysis pending.  She does feel somewhat better, but with persistent mild symptoms requires additional monitoring, lab results, COVID results.  Dr. Blinda Leatherwood aware. Final Clinical Impression(s) / ED Diagnoses Final diagnoses:  Bad headache  Tachycardia  Nausea and vomiting in pregnancy     Gerhard Munch, MD 02/03/21 0003

## 2021-02-02 NOTE — ED Notes (Signed)
Pt is AxO x 4, pt states that she is very anxious and has a hx of anxiety, went to UC today for SOB and was given prescription for inhalers that she has not been able to fill yet, took at home covid test that was negative.

## 2021-02-02 NOTE — ED Triage Notes (Addendum)
Pt c/o headache x 1 week. Pt also reports shortness of breath and feeling confused.

## 2021-02-02 NOTE — ED Provider Notes (Signed)
Emergency Medicine Provider Triage Evaluation Note  Taylor Rios , a 25 y.o. female  was evaluated in triage.  Pt complains of headache, multiple episodes of vomiting, shortness of breath feeling confused. Patient is a G2P1001 [redacted]w[redacted]d woman who presents today for evaluation of headache for 1 week.  She was seen by OB/GYN earlier today she had also reported some shortness of breath.  She was given Claritin however she vomited back up.  She has vomited multiple times today.  History is obtained from patient, chart review, and mom who is at bedside.  Patient has been slow to respond according to mom, and has been confused. Patient does have an underlying history of anxiety. .  Review of Systems  Positive: Headache, vomiting, confusion Negative: Abdominal pain  Physical Exam  BP 140/74 (BP Location: Left Arm)   Pulse (!) 132   Temp 98.9 F (37.2 C) (Oral)   Resp (!) 25   Ht 5\' 7"  (1.702 m)   Wt 127 kg   LMP 06/25/2020   SpO2 98%   BMI 43.85 kg/m  Gen:   Sleepy, awakens to voice and is able to answer simple questions, appears unwell. Resp:  Normal effort  MSK:   Moves extremities slowly however is able to lift both arms and legs. Other:  Anxious, tearful once her mom comes in.  Medical Decision Making  Medically screening exam initiated at 9:22 PM.  Appropriate orders placed.  TRYNITI LAATSCH was informed that the remainder of the evaluation will be completed by another provider, this initial triage assessment does not replace that evaluation, and the importance of remaining in the ED until their evaluation is complete.  Patient is here for evaluation of headache, nausea and vomiting and concern for confusion.  She is 31 weeks 5 days.  I called charge given that she is tachycardic, tachypneic, diaphoretic with multiple episodes of vomiting, and is not able to clearly articulate or hold on a conversation at this time.  Patient will be taken to trauma C.  CT head is also ordered  given headache with multiple episodes of vomiting and concern for AMS.  Note: Portions of this report may have been transcribed using voice recognition software. Every effort was made to ensure accuracy; however, inadvertent computerized transcription errors may be present    Othelia Pulling 02/02/21 2126    2127, MD 02/03/21 0221

## 2021-02-03 DIAGNOSIS — O219 Vomiting of pregnancy, unspecified: Secondary | ICD-10-CM | POA: Diagnosis not present

## 2021-02-03 LAB — RAPID URINE DRUG SCREEN, HOSP PERFORMED
Amphetamines: NOT DETECTED
Barbiturates: NOT DETECTED
Benzodiazepines: NOT DETECTED
Cocaine: NOT DETECTED
Opiates: NOT DETECTED
Tetrahydrocannabinol: NOT DETECTED

## 2021-02-03 LAB — SARS CORONAVIRUS 2 (TAT 6-24 HRS): SARS Coronavirus 2: POSITIVE — AB

## 2021-02-03 MED ORDER — ALBUTEROL SULFATE HFA 108 (90 BASE) MCG/ACT IN AERS: 2.0000 | INHALATION_SPRAY | Freq: Once | RESPIRATORY_TRACT | Status: DC | PRN

## 2021-02-03 MED ORDER — FAMOTIDINE IN NACL 20-0.9 MG/50ML-% IV SOLN: 20.0000 mg | Freq: Once | INTRAVENOUS | Status: DC | PRN

## 2021-02-03 MED ORDER — METHYLPREDNISOLONE SODIUM SUCC 125 MG IJ SOLR: 125.0000 mg | Freq: Once | INTRAMUSCULAR | Status: DC | PRN

## 2021-02-03 MED ORDER — DIPHENHYDRAMINE HCL 50 MG/ML IJ SOLN: 50.0000 mg | Freq: Once | INTRAMUSCULAR | Status: DC | PRN

## 2021-02-03 MED ORDER — EPINEPHRINE 0.3 MG/0.3ML IJ SOAJ: 0.3000 mg | Freq: Once | INTRAMUSCULAR | Status: DC | PRN

## 2021-02-03 MED ORDER — BEBTELOVIMAB 175 MG/2 ML IV (EUA)
175.0000 mg | Freq: Once | INTRAMUSCULAR | Status: AC
  Administered 2021-02-03: 175 mg via INTRAVENOUS
  Filled 2021-02-03: qty 2

## 2021-02-03 MED ORDER — SODIUM CHLORIDE 0.9 % IV SOLN: INTRAVENOUS | Status: DC | PRN

## 2021-02-03 NOTE — Discharge Instructions (Addendum)
You need to quarantine for 5 days.  Your symptoms began on Sunday, that was day 0.  If you are feeling better and do not have a fever on Saturday, you can begin to go outside with a tight fitting mask.  If you develop chest pain or worsening shortness of breath, please come back to the ER for repeat evaluation.

## 2021-02-03 NOTE — ED Provider Notes (Signed)
Patient signed out to me by Dr. Jeraldine Loots.  Patient seen with shortness of breath, tachycardia.  She has developed a low-grade fever here.  She has had a headache in addition to her shortness of breath.  Work-up has been reassuring.  Tachycardia improved but not resolved with IV fluids.  Ultimately, COVID found to be positive which explains her constellation of symptoms.  Discussed with Dr. Conni Elliot, on-call for [facility].  Recommends monoclonal antibody infusion, as patient is pregnant and is unvaccinated.  She continues to do well here, does not have any hypoxia or signs of pneumonia/pneumonitis.  She will not require hospitalization at this time.  Given return precautions.   Gilda Crease, MD 02/03/21 0330

## 2021-03-05 DIAGNOSIS — Z3685 Encounter for antenatal screening for Streptococcus B: Secondary | ICD-10-CM | POA: Diagnosis not present

## 2021-03-05 LAB — OB RESULTS CONSOLE GBS: GBS: POSITIVE

## 2021-03-08 DIAGNOSIS — O26843 Uterine size-date discrepancy, third trimester: Secondary | ICD-10-CM | POA: Diagnosis not present

## 2021-03-08 DIAGNOSIS — Z3A35 35 weeks gestation of pregnancy: Secondary | ICD-10-CM | POA: Diagnosis not present

## 2021-03-09 ENCOUNTER — Other Ambulatory Visit: Payer: Self-pay | Admitting: Obstetrics and Gynecology

## 2021-03-19 ENCOUNTER — Encounter (HOSPITAL_COMMUNITY): Payer: Self-pay

## 2021-03-19 NOTE — Patient Instructions (Addendum)
NIAYA HICKOK  03/19/2021   Your procedure is scheduled on:  04/02/2021  Arrive at 1130 at [employer] on [address] at [facility] and [employer]. You are invited to use the FREE valet parking or use the  Visitor's parking deck.  Pick up the phone at the desk and dial [phone].  Call this number if you have problems the morning of surgery: [phone]   Remember:   Do not eat food:(After Midnight) Desps de medianoche.  Do not drink clear liquids: (After Midnight) Desps de medianoche.  Take these medicines the morning of surgery with A SIP OF WATER:  none   Do not wear jewelry, make-up or nail polish.  Do not wear lotions, powders, or perfumes. Do not wear deodorant.  Do not shave 48 hours prior to surgery.  Do not bring valuables to the hospital.  [facility] is not   responsible for any belongings or valuables brought to the hospital.  Contacts, dentures or bridgework may not be worn into surgery.  Leave suitcase in the car. After surgery it may be brought to your room.  For patients admitted to the hospital, checkout time is 11:00 AM the day of              discharge.      Please read over the following fact sheets that you were given: Preparing for Surgery

## 2021-03-24 ENCOUNTER — Encounter (HOSPITAL_COMMUNITY): Payer: Self-pay | Admitting: Obstetrics and Gynecology

## 2021-03-31 ENCOUNTER — Other Ambulatory Visit: Payer: Self-pay

## 2021-03-31 ENCOUNTER — Encounter (HOSPITAL_COMMUNITY)
Admission: RE | Admit: 2021-03-31 | Discharge: 2021-03-31 | Disposition: A | Discharge status: Home / Self Care | Payer: BC Managed Care – PPO | Source: Ambulatory Visit | Attending: Obstetrics and Gynecology | Admitting: Obstetrics and Gynecology

## 2021-03-31 DIAGNOSIS — Z3A39 39 weeks gestation of pregnancy: Secondary | ICD-10-CM | POA: Diagnosis not present

## 2021-03-31 DIAGNOSIS — O34211 Maternal care for low transverse scar from previous cesarean delivery: Secondary | ICD-10-CM | POA: Diagnosis not present

## 2021-03-31 DIAGNOSIS — O34219 Maternal care for unspecified type scar from previous cesarean delivery: Secondary | ICD-10-CM | POA: Diagnosis not present

## 2021-03-31 DIAGNOSIS — Z01812 Encounter for preprocedural laboratory examination: Secondary | ICD-10-CM | POA: Insufficient documentation

## 2021-03-31 DIAGNOSIS — D62 Acute posthemorrhagic anemia: Secondary | ICD-10-CM | POA: Diagnosis not present

## 2021-03-31 DIAGNOSIS — E02 Subclinical iodine-deficiency hypothyroidism: Secondary | ICD-10-CM | POA: Diagnosis not present

## 2021-03-31 DIAGNOSIS — O9081 Anemia of the puerperium: Secondary | ICD-10-CM | POA: Diagnosis not present

## 2021-03-31 DIAGNOSIS — O99824 Streptococcus B carrier state complicating childbirth: Secondary | ICD-10-CM | POA: Diagnosis not present

## 2021-03-31 DIAGNOSIS — O99214 Obesity complicating childbirth: Secondary | ICD-10-CM | POA: Diagnosis not present

## 2021-03-31 LAB — CBC
HCT: 35.7 % — ABNORMAL LOW (ref 36.0–46.0)
Hemoglobin: 11.5 g/dL — ABNORMAL LOW (ref 12.0–15.0)
MCH: 28.6 pg (ref 26.0–34.0)
MCHC: 32.2 g/dL (ref 30.0–36.0)
MCV: 88.8 fL (ref 80.0–100.0)
Platelets: 166 10*3/uL (ref 150–400)
RBC: 4.02 MIL/uL (ref 3.87–5.11)
RDW: 13.5 % (ref 11.5–15.5)
WBC: 8.1 10*3/uL (ref 4.0–10.5)
nRBC: 0 % (ref 0.0–0.2)

## 2021-03-31 LAB — TYPE AND SCREEN
ABO/RH(D): A POS
Antibody Screen: NEGATIVE

## 2021-03-31 LAB — RPR: RPR Ser Ql: NONREACTIVE

## 2021-04-02 ENCOUNTER — Encounter (HOSPITAL_COMMUNITY): Payer: Self-pay | Admitting: Obstetrics and Gynecology

## 2021-04-02 ENCOUNTER — Other Ambulatory Visit: Payer: Self-pay

## 2021-04-02 ENCOUNTER — Inpatient Hospital Stay (HOSPITAL_COMMUNITY)
Admission: RE | Admit: 2021-04-02 | Discharge: 2021-04-06 | DRG: 787 | Disposition: A | Discharge status: Home / Self Care | Payer: BC Managed Care – PPO | Attending: Obstetrics and Gynecology | Admitting: Obstetrics and Gynecology

## 2021-04-02 ENCOUNTER — Inpatient Hospital Stay (HOSPITAL_COMMUNITY): Payer: BC Managed Care – PPO | Admitting: Anesthesiology

## 2021-04-02 ENCOUNTER — Encounter (HOSPITAL_COMMUNITY)
Admission: RE | Disposition: A | Discharge status: Home / Self Care | Payer: Self-pay | Source: Home / Self Care | Attending: Obstetrics and Gynecology

## 2021-04-02 DIAGNOSIS — D62 Acute posthemorrhagic anemia: Secondary | ICD-10-CM | POA: Diagnosis not present

## 2021-04-02 DIAGNOSIS — O99214 Obesity complicating childbirth: Secondary | ICD-10-CM | POA: Diagnosis present

## 2021-04-02 DIAGNOSIS — O9081 Anemia of the puerperium: Secondary | ICD-10-CM | POA: Diagnosis not present

## 2021-04-02 DIAGNOSIS — Z98891 History of uterine scar from previous surgery: Secondary | ICD-10-CM

## 2021-04-02 DIAGNOSIS — Z3A39 39 weeks gestation of pregnancy: Secondary | ICD-10-CM | POA: Diagnosis not present

## 2021-04-02 DIAGNOSIS — O99824 Streptococcus B carrier state complicating childbirth: Secondary | ICD-10-CM | POA: Diagnosis present

## 2021-04-02 DIAGNOSIS — O34219 Maternal care for unspecified type scar from previous cesarean delivery: Secondary | ICD-10-CM | POA: Diagnosis present

## 2021-04-02 SURGERY — Surgical Case
Anesthesia: Spinal | Wound class: Clean Contaminated

## 2021-04-02 MED ORDER — POVIDONE-IODINE 10 % EX SWAB: 2.0000 "application " | Freq: Once | CUTANEOUS | Status: DC

## 2021-04-02 MED ORDER — BUPIVACAINE HCL (PF) 0.25 % IJ SOLN
INTRAMUSCULAR | Status: DC | PRN
  Administered 2021-04-02: 30 mL

## 2021-04-02 MED ORDER — NALOXONE HCL 0.4 MG/ML IJ SOLN: 0.4000 mg | INTRAMUSCULAR | Status: DC | PRN

## 2021-04-02 MED ORDER — ONDANSETRON HCL 4 MG/2ML IJ SOLN
INTRAMUSCULAR | Status: AC
  Filled 2021-04-02: qty 2

## 2021-04-02 MED ORDER — DEXAMETHASONE SODIUM PHOSPHATE 4 MG/ML IJ SOLN
INTRAMUSCULAR | Status: AC
  Filled 2021-04-02: qty 1

## 2021-04-02 MED ORDER — IBUPROFEN 600 MG PO TABS
600.0000 mg | ORAL_TABLET | Freq: Four times a day (QID) | ORAL | Status: DC
  Administered 2021-04-03 – 2021-04-06 (×10): 600 mg via ORAL
  Filled 2021-04-02 (×11): qty 1

## 2021-04-02 MED ORDER — NALOXONE HCL 4 MG/10ML IJ SOLN
1.0000 ug/kg/h | INTRAVENOUS | Status: DC | PRN
  Filled 2021-04-02: qty 5

## 2021-04-02 MED ORDER — KETOROLAC TROMETHAMINE 30 MG/ML IJ SOLN
30.0000 mg | Freq: Once | INTRAMUSCULAR | Status: AC | PRN
  Administered 2021-04-02: 30 mg via INTRAVENOUS

## 2021-04-02 MED ORDER — LACTATED RINGERS IV SOLN: INTRAVENOUS | Status: DC | PRN

## 2021-04-02 MED ORDER — EPHEDRINE 5 MG/ML INJ
INTRAVENOUS | Status: AC
  Filled 2021-04-02: qty 5

## 2021-04-02 MED ORDER — SODIUM CHLORIDE 0.9 % IR SOLN
Status: DC | PRN
  Administered 2021-04-02: 1000 mL

## 2021-04-02 MED ORDER — OXYTOCIN-SODIUM CHLORIDE 30-0.9 UT/500ML-% IV SOLN
INTRAVENOUS | Status: AC
  Filled 2021-04-02: qty 500

## 2021-04-02 MED ORDER — DEXAMETHASONE SODIUM PHOSPHATE 4 MG/ML IJ SOLN
INTRAMUSCULAR | Status: DC | PRN
  Administered 2021-04-02: 4 mg via INTRAVENOUS

## 2021-04-02 MED ORDER — MORPHINE SULFATE (PF) 0.5 MG/ML IJ SOLN
INTRAMUSCULAR | Status: DC | PRN
  Administered 2021-04-02: 150 ug via INTRATHECAL

## 2021-04-02 MED ORDER — PRENATAL MULTIVITAMIN CH
1.0000 | ORAL_TABLET | Freq: Every day | ORAL | Status: DC
  Administered 2021-04-03 – 2021-04-06 (×4): 1 via ORAL
  Filled 2021-04-02 (×4): qty 1

## 2021-04-02 MED ORDER — WITCH HAZEL-GLYCERIN EX PADS: 1.0000 "application " | MEDICATED_PAD | CUTANEOUS | Status: DC | PRN

## 2021-04-02 MED ORDER — CEFAZOLIN IN SODIUM CHLORIDE 3-0.9 GM/100ML-% IV SOLN
INTRAVENOUS | Status: AC
  Filled 2021-04-02: qty 100

## 2021-04-02 MED ORDER — SIMETHICONE 80 MG PO CHEW
80.0000 mg | CHEWABLE_TABLET | Freq: Three times a day (TID) | ORAL | Status: DC
  Administered 2021-04-03 – 2021-04-06 (×7): 80 mg via ORAL
  Filled 2021-04-02 (×8): qty 1

## 2021-04-02 MED ORDER — KETOROLAC TROMETHAMINE 30 MG/ML IJ SOLN
INTRAMUSCULAR | Status: AC
  Filled 2021-04-02: qty 1

## 2021-04-02 MED ORDER — MORPHINE SULFATE (PF) 0.5 MG/ML IJ SOLN
INTRAMUSCULAR | Status: AC
  Filled 2021-04-02: qty 10

## 2021-04-02 MED ORDER — OXYCODONE HCL 5 MG PO TABS: 5.0000 mg | ORAL_TABLET | Freq: Once | ORAL | Status: DC | PRN

## 2021-04-02 MED ORDER — MENTHOL 3 MG MT LOZG: 1.0000 | LOZENGE | OROMUCOSAL | Status: DC | PRN

## 2021-04-02 MED ORDER — SENNOSIDES-DOCUSATE SODIUM 8.6-50 MG PO TABS
2.0000 | ORAL_TABLET | Freq: Every day | ORAL | Status: DC
  Administered 2021-04-03 – 2021-04-06 (×4): 2 via ORAL
  Filled 2021-04-02 (×4): qty 2

## 2021-04-02 MED ORDER — PHENYLEPHRINE HCL-NACL 20-0.9 MG/250ML-% IV SOLN
INTRAVENOUS | Status: AC
  Filled 2021-04-02: qty 250

## 2021-04-02 MED ORDER — OXYTOCIN-SODIUM CHLORIDE 30-0.9 UT/500ML-% IV SOLN
INTRAVENOUS | Status: DC | PRN
  Administered 2021-04-02: 30 [IU] via INTRAVENOUS

## 2021-04-02 MED ORDER — BUPIVACAINE IN DEXTROSE 0.75-8.25 % IT SOLN
INTRATHECAL | Status: DC | PRN
  Administered 2021-04-02: 1.6 mL via INTRATHECAL

## 2021-04-02 MED ORDER — PROMETHAZINE HCL 25 MG/ML IJ SOLN
6.2500 mg | INTRAMUSCULAR | Status: DC | PRN
  Administered 2021-04-02: 6.25 mg via INTRAVENOUS

## 2021-04-02 MED ORDER — FENTANYL CITRATE (PF) 100 MCG/2ML IJ SOLN
INTRAMUSCULAR | Status: AC
  Filled 2021-04-02: qty 2

## 2021-04-02 MED ORDER — LACTATED RINGERS IV SOLN: INTRAVENOUS | Status: DC

## 2021-04-02 MED ORDER — BUPIVACAINE HCL (PF) 0.25 % IJ SOLN
INTRAMUSCULAR | Status: AC
  Filled 2021-04-02: qty 30

## 2021-04-02 MED ORDER — ONDANSETRON HCL 4 MG/2ML IJ SOLN
INTRAMUSCULAR | Status: DC | PRN
  Administered 2021-04-02: 4 mg via INTRAVENOUS

## 2021-04-02 MED ORDER — PROMETHAZINE HCL 25 MG/ML IJ SOLN
INTRAMUSCULAR | Status: AC
  Filled 2021-04-02: qty 1

## 2021-04-02 MED ORDER — OXYCODONE-ACETAMINOPHEN 5-325 MG PO TABS
1.0000 | ORAL_TABLET | ORAL | Status: DC | PRN
  Administered 2021-04-03: 1 via ORAL
  Administered 2021-04-04 – 2021-04-05 (×5): 2 via ORAL
  Filled 2021-04-02 (×2): qty 2
  Filled 2021-04-02: qty 1
  Filled 2021-04-02 (×3): qty 2

## 2021-04-02 MED ORDER — METHYLERGONOVINE MALEATE 0.2 MG/ML IJ SOLN
0.2000 mg | INTRAMUSCULAR | Status: DC | PRN
Start: 2021-04-02 — End: 2021-04-07

## 2021-04-02 MED ORDER — OXYCODONE HCL 5 MG/5ML PO SOLN: 5.0000 mg | Freq: Once | ORAL | Status: DC | PRN

## 2021-04-02 MED ORDER — DIPHENHYDRAMINE HCL 25 MG PO CAPS
25.0000 mg | ORAL_CAPSULE | Freq: Four times a day (QID) | ORAL | Status: DC | PRN
  Administered 2021-04-03: 25 mg via ORAL

## 2021-04-02 MED ORDER — PHENYLEPHRINE HCL-NACL 20-0.9 MG/250ML-% IV SOLN
INTRAVENOUS | Status: DC | PRN
  Administered 2021-04-02: 60 ug/min via INTRAVENOUS

## 2021-04-02 MED ORDER — DIBUCAINE (PERIANAL) 1 % EX OINT: 1.0000 "application " | TOPICAL_OINTMENT | CUTANEOUS | Status: DC | PRN

## 2021-04-02 MED ORDER — NALBUPHINE HCL 10 MG/ML IJ SOLN: 5.0000 mg | INTRAMUSCULAR | Status: DC | PRN

## 2021-04-02 MED ORDER — NALBUPHINE HCL 10 MG/ML IJ SOLN: 5.0000 mg | Freq: Once | INTRAMUSCULAR | Status: DC | PRN

## 2021-04-02 MED ORDER — MEPERIDINE HCL 25 MG/ML IJ SOLN: 6.2500 mg | INTRAMUSCULAR | Status: DC | PRN

## 2021-04-02 MED ORDER — EPHEDRINE SULFATE 50 MG/ML IJ SOLN
INTRAMUSCULAR | Status: DC | PRN
  Administered 2021-04-02: 10 mg via INTRAVENOUS

## 2021-04-02 MED ORDER — CEFAZOLIN IN SODIUM CHLORIDE 3-0.9 GM/100ML-% IV SOLN
3.0000 g | INTRAVENOUS | Status: DC
Start: 2021-04-03 — End: 2021-04-02

## 2021-04-02 MED ORDER — SODIUM CHLORIDE 0.9% FLUSH: 3.0000 mL | INTRAVENOUS | Status: DC | PRN

## 2021-04-02 MED ORDER — HYDROMORPHONE HCL 1 MG/ML IJ SOLN: 0.2500 mg | INTRAMUSCULAR | Status: DC | PRN

## 2021-04-02 MED ORDER — TETANUS-DIPHTH-ACELL PERTUSSIS 5-2.5-18.5 LF-MCG/0.5 IM SUSY: 0.5000 mL | PREFILLED_SYRINGE | Freq: Once | INTRAMUSCULAR | Status: DC

## 2021-04-02 MED ORDER — SIMETHICONE 80 MG PO CHEW: 80.0000 mg | CHEWABLE_TABLET | ORAL | Status: DC | PRN

## 2021-04-02 MED ORDER — KETOROLAC TROMETHAMINE 30 MG/ML IJ SOLN
30.0000 mg | Freq: Four times a day (QID) | INTRAMUSCULAR | Status: AC
  Administered 2021-04-02 – 2021-04-03 (×3): 30 mg via INTRAVENOUS
  Filled 2021-04-02 (×3): qty 1

## 2021-04-02 MED ORDER — STERILE WATER FOR IRRIGATION IR SOLN
Status: DC | PRN
  Administered 2021-04-02: 1000 mL

## 2021-04-02 MED ORDER — FENTANYL CITRATE (PF) 100 MCG/2ML IJ SOLN
INTRAMUSCULAR | Status: DC | PRN
  Administered 2021-04-02: 15 ug via INTRATHECAL

## 2021-04-02 MED ORDER — DIPHENHYDRAMINE HCL 25 MG PO CAPS
25.0000 mg | ORAL_CAPSULE | ORAL | Status: DC | PRN
  Filled 2021-04-02: qty 1

## 2021-04-02 MED ORDER — OXYTOCIN-SODIUM CHLORIDE 30-0.9 UT/500ML-% IV SOLN
2.5000 [IU]/h | INTRAVENOUS | Status: AC
  Administered 2021-04-02: 2.5 [IU]/h via INTRAVENOUS

## 2021-04-02 MED ORDER — ZOLPIDEM TARTRATE 5 MG PO TABS: 5.0000 mg | ORAL_TABLET | Freq: Every evening | ORAL | Status: DC | PRN

## 2021-04-02 MED ORDER — DIPHENHYDRAMINE HCL 50 MG/ML IJ SOLN: 12.5000 mg | INTRAMUSCULAR | Status: DC | PRN

## 2021-04-02 MED ORDER — COCONUT OIL OIL: 1.0000 "application " | TOPICAL_OIL | Status: DC | PRN

## 2021-04-02 MED ORDER — METHYLERGONOVINE MALEATE 0.2 MG PO TABS
0.2000 mg | ORAL_TABLET | ORAL | Status: DC | PRN
Start: 2021-04-02 — End: 2021-04-07

## 2021-04-02 MED ORDER — DEXTROSE 5 % IV SOLN
INTRAVENOUS | Status: DC | PRN
  Administered 2021-04-02: 3 g via INTRAVENOUS

## 2021-04-02 SURGICAL SUPPLY — 40 items
APL SKNCLS STERI-STRIP NONHPOA (GAUZE/BANDAGES/DRESSINGS) ×1
BENZOIN TINCTURE PRP APPL 2/3 (GAUZE/BANDAGES/DRESSINGS) ×1 IMPLANT
CHLORAPREP W/TINT 26ML (MISCELLANEOUS) ×2 IMPLANT
CLAMP CORD UMBIL (MISCELLANEOUS) IMPLANT
CLOTH BEACON ORANGE TIMEOUT ST (SAFETY) ×2 IMPLANT
DRAPE C SECTION CLR SCREEN (DRAPES) ×1 IMPLANT
DRSG OPSITE POSTOP 4X10 (GAUZE/BANDAGES/DRESSINGS) ×2 IMPLANT
ELECT REM PT RETURN 9FT ADLT (ELECTROSURGICAL) ×2
ELECTRODE REM PT RTRN 9FT ADLT (ELECTROSURGICAL) ×1 IMPLANT
EXTRACTOR VACUUM KIWI (MISCELLANEOUS) ×1 IMPLANT
GLOVE BIO SURGEON STRL SZ7.5 (GLOVE) ×2 IMPLANT
GLOVE BIOGEL PI IND STRL 7.0 (GLOVE) ×1 IMPLANT
GLOVE BIOGEL PI INDICATOR 7.0 (GLOVE) ×1
GOWN STRL REUS W/TWL LRG LVL3 (GOWN DISPOSABLE) ×4 IMPLANT
HOVERMATT SINGLE USE (MISCELLANEOUS) ×1 IMPLANT
KIT ABG SYR 3ML LUER SLIP (SYRINGE) IMPLANT
NDL HYPO 25X5/8 SAFETYGLIDE (NEEDLE) IMPLANT
NDL SPNL 20GX3.5 QUINCKE YW (NEEDLE) IMPLANT
NEEDLE HYPO 22GX1.5 SAFETY (NEEDLE) ×3 IMPLANT
NEEDLE HYPO 25X5/8 SAFETYGLIDE (NEEDLE) IMPLANT
NEEDLE SPNL 20GX3.5 QUINCKE YW (NEEDLE) IMPLANT
NS IRRIG 1000ML POUR BTL (IV SOLUTION) ×2 IMPLANT
PACK C SECTION WH (CUSTOM PROCEDURE TRAY) ×2 IMPLANT
PENCIL SMOKE EVAC W/HOLSTER (ELECTROSURGICAL) ×2 IMPLANT
SPONGE T-LAP 18X18 ~~LOC~~+RFID (SPONGE) ×5 IMPLANT
STRIP CLOSURE SKIN 1/2X4 (GAUZE/BANDAGES/DRESSINGS) ×1 IMPLANT
SUT MNCRL 0 VIOLET CTX 36 (SUTURE) ×2 IMPLANT
SUT MNCRL AB 3-0 PS2 27 (SUTURE) IMPLANT
SUT MON AB 2-0 CT1 27 (SUTURE) ×2 IMPLANT
SUT MON AB-0 CT1 36 (SUTURE) ×5 IMPLANT
SUT MONOCRYL 0 CTX 36 (SUTURE) ×6
SUT PLAIN 0 NONE (SUTURE) IMPLANT
SUT PLAIN 2 0 (SUTURE)
SUT PLAIN 2 0 XLH (SUTURE) IMPLANT
SUT PLAIN ABS 2-0 CT1 27XMFL (SUTURE) IMPLANT
SYR 20CC LL (SYRINGE) IMPLANT
SYR CONTROL 10ML LL (SYRINGE) ×3 IMPLANT
TOWEL OR 17X24 6PK STRL BLUE (TOWEL DISPOSABLE) ×2 IMPLANT
TRAY FOLEY W/BAG SLVR 14FR LF (SET/KITS/TRAYS/PACK) ×2 IMPLANT
WATER STERILE IRR 1000ML POUR (IV SOLUTION) ×2 IMPLANT

## 2021-04-02 NOTE — Anesthesia Preprocedure Evaluation (Signed)
Anesthesia Evaluation  Patient identified by MRN, date of birth, ID band Patient awake    Reviewed: Allergy & Precautions, NPO status , Patient's Chart, lab work & pertinent test results  Airway Mallampati: II  TM Distance: >3 FB Neck ROM: Full    Dental no notable dental hx. (+) Dental Advisory Given   Pulmonary neg pulmonary ROS,    Pulmonary exam normal breath sounds clear to auscultation       Cardiovascular negative cardio ROS Normal cardiovascular exam Rhythm:Regular Rate:Normal     Neuro/Psych Anxiety negative neurological ROS  negative psych ROS   GI/Hepatic negative GI ROS, Neg liver ROS,   Endo/Other  Morbid obesityobesity  Renal/GU negative Renal ROS  negative genitourinary   Musculoskeletal negative musculoskeletal ROS (+)   Abdominal (+) + obese,   Peds negative pediatric ROS (+)  Hematology negative hematology ROS (+)   Anesthesia Other Findings   Reproductive/Obstetrics (+) Pregnancy                             Anesthesia Physical  Anesthesia Plan  ASA: 3  Anesthesia Plan: Spinal   Post-op Pain Management:    Induction:   PONV Risk Score and Plan: 2 and Treatment may vary due to age or medical condition  Airway Management Planned: Natural Airway  Additional Equipment:   Intra-op Plan:   Post-operative Plan:   Informed Consent: I have reviewed the patients History and Physical, chart, labs and discussed the procedure including the risks, benefits and alternatives for the proposed anesthesia with the patient or authorized representative who has indicated his/her understanding and acceptance.     Dental advisory given  Plan Discussed with: CRNA  Anesthesia Plan Comments:         Anesthesia Quick Evaluation

## 2021-04-02 NOTE — H&P (Signed)
Taylor Rios is a 25 y.o. female presenting for rpt csection. OB History     Gravida  2   Para  1   Term  1   Preterm      AB      Living  1      SAB      IAB      Ectopic      Multiple  0   Live Births  1        Obstetric Comments  C/s ? Dilation, or descent        Past Medical History:  Diagnosis Date   ADHD (attention deficit hyperactivity disorder)    Anemia    Anxiety    Hx of chlamydia infection    Hx of gonorrhea    Subclinical hypothyroidism    Past Surgical History:  Procedure Laterality Date   CESAREAN SECTION N/A 03/09/2016   Procedure: CESAREAN SECTION;  Surgeon: Olivia Mackie, MD;  Location: [facility] BIRTHING SUITES;  Service: Obstetrics;  Laterality: N/A;   Family History: family history includes Asthma in her maternal grandmother; Cancer in her father; Diabetes in her maternal aunt and maternal grandmother; Heart disease in her father; Hypertension in her father and mother; Miscarriages / [place] in her maternal grandmother. Social History:  reports that she has never smoked. She has never used smokeless tobacco. She reports that she does not drink alcohol and does not use drugs.     Maternal Diabetes: No Genetic Screening: Normal Maternal Ultrasounds/Referrals: Normal Fetal Ultrasounds or other Referrals:  None Maternal Substance Abuse:  No Significant Maternal Medications:  None Significant Maternal Lab Results:  Group B Strep positive Other Comments:  None  Review of Systems  Constitutional: Negative.   All other systems reviewed and are negative. Maternal Medical History:  Reason for admission: Contractions.   Contractions: Onset was less than 1 hour ago.   Frequency: rare.   Perceived severity is mild.   Fetal activity: Perceived fetal activity is normal.   Last perceived fetal movement was within the past hour.   Prenatal complications: no prenatal complications Prenatal Complications - Diabetes: none.    Last  menstrual period 06/25/2020, unknown if currently breastfeeding. Maternal Exam:  Uterine Assessment: Contraction strength is mild.  Contraction frequency is rare.  Abdomen: Patient reports no abdominal tenderness. Introitus: Normal vulva. Normal vagina.  Ferning test: not done.  Nitrazine test: not done. Amniotic fluid character: not assessed. Pelvis: questionable for delivery.   Cervix: Cervix evaluated by digital exam.    Physical Exam Genitourinary:    General: Normal vulva.    Prenatal labs: ABO, Rh: --/--/A POS (10/19 0935) Antibody: NEG (10/19 0935) Rubella: Immune (06/14 0000) RPR: NON REACTIVE (10/19 0935)  HBsAg: Negative (06/14 0000)  HIV: Non-reactive (06/14 0000)  GBS: Positive/-- (09/23 0000)   Assessment/Plan: 39wk IUP Previous csection for rpt Surgical risks discussed Consent done   Taylor Rios J 04/02/2021, 12:39 PM

## 2021-04-02 NOTE — Anesthesia Procedure Notes (Signed)
Spinal  Patient location during procedure: OB Start time: 04/02/2021 1:34 PM End time: 04/02/2021 1:39 PM Reason for block: surgical anesthesia Staffing Performed: anesthesiologist  Anesthesiologist: Lowella Curb, MD Preanesthetic Checklist Completed: patient identified, IV checked, risks and benefits discussed, surgical consent, monitors and equipment checked, pre-op evaluation and timeout performed Spinal Block Patient position: sitting Prep: DuraPrep and site prepped and draped Patient monitoring: heart rate, cardiac monitor, continuous pulse ox and blood pressure Approach: midline Location: L3-4 Injection technique: single-shot Needle Needle type: Pencan  Needle gauge: 24 G Needle length: 10 cm Assessment Sensory level: T4 Events: CSF return

## 2021-04-02 NOTE — Op Note (Signed)
Cesarean Section Procedure Note  Indications: previous uterine incision kerr x one  Pre-operative Diagnosis: 39 week 2 day pregnancy.  Post-operative Diagnosis: same  Surgeon: Lenoard Aden   Assistants: Renae Fickle, cnm  Anesthesia: Local anesthesia 0.25.% bupivacaine and Spinal anesthesia  ASA Class: 2  Procedure Details  The patient was seen in the Holding Room. The risks, benefits, complications, treatment options, and expected outcomes were discussed with the patient.  The patient concurred with the proposed plan, giving informed consent. The risks of anesthesia, infection, bleeding and possible injury to other organs discussed. Injury to bowel, bladder, or ureter with possible need for repair discussed. Possible need for transfusion with secondary risks of hepatitis or HIV acquisition discussed. Post operative complications to include but not limited to DVT, PE and Pneumonia noted. The site of surgery properly noted/marked. The patient was taken to Operating Room # B, identified as Taylor Rios and the procedure verified as C-Section Delivery. A Time Out was held and the above information confirmed.  After induction of anesthesia, the patient was draped and prepped in the usual sterile manner. A Pfannenstiel incision was made and carried down through the subcutaneous tissue to the fascia. Fascial incision was made and extended transversely using Mayo scissors. The fascia was separated from the underlying rectus tissue superiorly and inferiorly. The peritoneum was identified and entered. Peritoneal incision was extended longitudinally. The utero-vesical peritoneal reflection was incised transversely and the bladder flap was bluntly freed from the lower uterine segment. A low transverse uterine incision(Kerr hysterotomy) was made. Delivered from OA presentation with vacuum assistance x two pulls was a  female with Apgar scores of 8 at one minute and 9 at five minutes. Bulb suctioning gently  performed. Neonatal team in attendance.After the umbilical cord was clamped and cut cord blood was obtained for evaluation. The placenta was removed intact and appeared normal. The uterus was curetted with a dry lap pack. Good hemostasis was noted.The uterine outline, tubes and ovaries appeared normal. The uterine incision was closed with running locked sutures of 0 Monocryl x 2 layers. Hemostasis was observed. The fascia was then reapproximated with running sutures of 0 Monocryl. The skin was reapproximated with 3-0 monocryl after [place] closure with 2-0 plain.  Instrument, sponge, and needle counts were correct prior the abdominal closure and at the conclusion of the case.   Findings: FTLM, OA, post placenta  Estimated Blood Loss:   600         Drains: foley                 Specimens: placenta                 Complications:  None; patient tolerated the procedure well.         Disposition: PACU - hemodynamically stable.         Condition: stable  Attending Attestation: I performed the procedure.

## 2021-04-02 NOTE — Anesthesia Postprocedure Evaluation (Signed)
Anesthesia Post Note  Patient: Taylor Rios  Procedure(s) Performed: Repeat CESAREAN SECTION     Patient location during evaluation: PACU Anesthesia Type: Spinal Level of consciousness: awake and alert Pain management: pain level controlled Vital Signs Assessment: post-procedure vital signs reviewed and stable Respiratory status: spontaneous breathing, nonlabored ventilation and respiratory function stable Cardiovascular status: blood pressure returned to baseline and stable Postop Assessment: no apparent nausea or vomiting Anesthetic complications: no   No notable events documented.  Last Vitals:  Vitals:   04/02/21 1600 04/02/21 1628  BP: (!) 124/59 (!) 104/59  Pulse: 100 92  Resp: (!) 21 18  Temp:  36.8 C  SpO2: 100% 97%    Last Pain:  Vitals:   04/02/21 1628  TempSrc: Oral  PainSc:    Pain Goal:                   Lowella Curb

## 2021-04-02 NOTE — Transfer of Care (Signed)
Immediate Anesthesia Transfer of Care Note  Patient: Taylor Rios  Procedure(s) Performed: Repeat CESAREAN SECTION  Patient Location: PACU  Anesthesia Type:Regional  Level of Consciousness: awake, alert , oriented and patient cooperative  Airway & Oxygen Therapy: Patient Spontanous Breathing  Post-op Assessment: Report given to RN and Post -op Vital signs reviewed and stable  Post vital signs: Reviewed and stable  Last Vitals:  Vitals Value Taken Time  BP    Temp    Pulse    Resp    SpO2      Last Pain: There were no vitals filed for this visit.       Complications: No notable events documented.

## 2021-04-03 DIAGNOSIS — D62 Acute posthemorrhagic anemia: Secondary | ICD-10-CM | POA: Diagnosis not present

## 2021-04-03 LAB — CBC
HCT: 28.8 % — ABNORMAL LOW (ref 36.0–46.0)
Hemoglobin: 9.2 g/dL — ABNORMAL LOW (ref 12.0–15.0)
MCH: 28.1 pg (ref 26.0–34.0)
MCHC: 31.9 g/dL (ref 30.0–36.0)
MCV: 88.1 fL (ref 80.0–100.0)
Platelets: 142 10*3/uL — ABNORMAL LOW (ref 150–400)
RBC: 3.27 MIL/uL — ABNORMAL LOW (ref 3.87–5.11)
RDW: 13.3 % (ref 11.5–15.5)
WBC: 10 10*3/uL (ref 4.0–10.5)
nRBC: 0 % (ref 0.0–0.2)

## 2021-04-03 MED ORDER — POLYSACCHARIDE IRON COMPLEX 150 MG PO CAPS
150.0000 mg | ORAL_CAPSULE | Freq: Every day | ORAL | Status: DC
  Administered 2021-04-03 – 2021-04-06 (×3): 150 mg via ORAL
  Filled 2021-04-03 (×4): qty 1

## 2021-04-03 MED ORDER — MAGNESIUM OXIDE -MG SUPPLEMENT 400 (240 MG) MG PO TABS
400.0000 mg | ORAL_TABLET | Freq: Every day | ORAL | Status: DC
  Administered 2021-04-03 – 2021-04-06 (×4): 400 mg via ORAL
  Filled 2021-04-03 (×4): qty 1

## 2021-04-03 NOTE — Progress Notes (Signed)
Subjective: POD# 1 Live born female  Birth Weight:   APGAR: 7, 8  Newborn Delivery   Birth date/time: 04/02/2020 14:04:00 Delivery type: C-Section, Vacuum Assisted Trial of labor: No C-section categorization: Repeat     Baby name: Kylen in NICU for respiratory distress Delivering provider: Olivia Mackie   Circumcision planning  Feeding: breast and bottle  Pain control at delivery: Spinal   Visiting baby in NICU with her mother. Reports feeling ok. Sore but able to ambulate independently. Sad that baby needed NICU admit. Pumping for baby. Bleeding minimal.  Patient reports tolerating PO.   Pain controlled with acetaminophen and prescription NSAID's including ketorolac (Toradol) Denies HA/SOB/C/P/N/V/dizziness. She reports vaginal bleeding as normal, without clots. She is ambulating and urinating without difficulty.     Objective:  Vitals:   04/02/21 2028 04/03/21 0005 04/03/21 0200 04/03/21 0556  BP: (!) 128/59 111/63  114/75  Pulse: 89 84  80  Resp: 18 18 16 18   Temp: 98.1 F (36.7 C) 98.2 F (36.8 C)  98.3 F (36.8 C)  TempSrc: Oral Oral  Oral  SpO2: 99% 99% 99% 100%  Weight:      Height:         Intake/Output Summary (Last 24 hours) at 04/03/2021 1047 Last data filed at 04/03/2021 0600 Gross per 24 hour  Intake 2350 ml  Output 1708 ml  Net 642 ml      Recent Labs    04/03/21 0514  WBC 10.0  HGB 9.2*  HCT 28.8*  PLT 142*    Blood type: --/--/A POS (10/19 0935)  Rubella: Immune (06/14 0000)   Physical Exam:  General: alert and cooperative CV: Regular rate and rhythm Resp: clear Abdomen: soft, nontender, normal bowel sounds Incision: clean, dry, and intact Uterine Fundus: firm, below umbilicus, nontender Lochia: minimal Ext: extremities normal, atraumatic, no cyanosis or edema  Assessment/Plan: 25 y.o.   POD# 1. 24                  Active Problems:   Postpartum care following cesarean delivery 10/21 Breastfeeding support Encourage to  ambulate Routine post-op care   Cesarean delivery - repeat   Previous cesarean section   Anemia associated with acute blood loss  Asymptomatic  Start PO Niferex and mag oxide  Anticipate discharge tomorrow.   11/21, CNM, MSN 04/03/2021, 10:47 AM

## 2021-04-03 NOTE — Lactation Note (Signed)
This note was copied from a baby's chart.  NICU Lactation Consultation Note  Patient Name: Taylor Rios JHERD'E Date: 04/03/2021 Age:24 hours   Subjective Reason for consult: NICU baby; Initial assessment Mother bf her last baby and looks forward to breastfeeding this baby. She stopped bf'ing early with first child because of RTW. She denies hx of breast surgery or trauma and recalls a normal milk supply with first child.   Mother has pumped once since delivery. She is feeling better this morning and ready to begin routine pumping until baby is cleared to bf.  We discussed the potential for a delay in copious milk because of mother's blood loss during her c-section. We reviewed donor milk use. RN to f/u for consent prn.   Baby has been NPO since NICU admission. The NNP was present during [facility] visit and ok'ed bf attempt. LC heard audible panting and observed increased RR with brief bf attempt. We discontinued bf attempt. Mom and baby remained sts. LC reported to RN.   Objective Infant data: Mother's Current Feeding Choice: Breast Milk LGA,  2L HFNC Infant feeding assessment RR > 90 with brief bf attempt    Maternal data: Blood loss at delivery > G2P2002  C-Section, Vacuum Assisted Significant Breast History:: wide-spaced breasts Current breast feeding challenges:: Baby is in NICU and NPO Does the patient have breastfeeding experience prior to this delivery?: Yes How long did the patient breastfeed?: 3 months  Pump: Personal (Lansinoh)  Assessment Infant: Feeding Status: NPO   Maternal: Mother is at-risk for delay of copious milk and low milk supply because of excessive blood loss. Although she has wide-spacing of breasts, she has a history of normal milk production.  Intervention/Plan Interventions: Education; Support pillows; Breast feeding basics reviewed; Assisted with latch; Position options; Skin to skin; "The NICU and Your Baby" book; [employer]  brochure; Infant Driven Feeding Algorithm education; Hand express; Pre-pump if needed  Plan: Consult Status: Follow-up  NICU Follow-up type: New admission follow up  Mother to begin pumping q3 for 15 minutes. She can hand express for infant's oral care during his NPO status.   Elder Negus 04/03/2021, 10:52 AM

## 2021-04-04 NOTE — Progress Notes (Signed)
Subjective: POD# 2 Live born female  Birth Weight:   APGAR: 7, 8  Newborn Delivery   Birth date/time: [birth year]20[birth year] 14:04:00 Delivery type: C-Section, Vacuum Assisted Trial of labor: No C-section categorization: Repeat     Baby name: Taylor Rios in NICU, improving  Delivering provider: Olivia Mackie   Circumcision planning  Feeding: NG tube in NICU  Pain control at delivery: Spinal   Feeling better today with baby improving in the NICU. Pumping for baby. Bleeding minimal. Pain is increased today.   Patient reports tolerating PO.   Pain controlled with acetaminophen and prescription NSAID's including ketorolac (Toradol) Denies HA/SOB/C/P/N/V/dizziness. She reports vaginal bleeding as normal, without clots. She is ambulating and urinating without difficulty.     Objective:  Vitals:   04/03/21 0556 04/03/21 1412 04/03/21 2200 04/04/21 0547  BP: 114/75 115/73 126/84 (!) 110/59  Pulse: 80 91 98 79  Resp: 18 17 19 18   Temp: 98.3 F (36.8 C) 98 F (36.7 C) 98.3 F (36.8 C) 98 F (36.7 C)  TempSrc: Oral Oral Oral Oral  SpO2: 100% 100% 99% 99%  Weight:      Height:        No intake or output data in the 24 hours ending 04/04/21 1109     Recent Labs    04/03/21 0514  WBC 10.0  HGB 9.2*  HCT 28.8*  PLT 142*     Blood type: --/--/A POS (10/19 0935)  Rubella: Immune (06/14 0000)   Physical Exam:  General: alert and cooperative CV: Regular rate and rhythm Resp: clear Abdomen: soft, nontender, normal bowel sounds Incision: clean, dry, and intact Uterine Fundus: firm, below umbilicus, nontender Lochia: minimal Ext: extremities normal, atraumatic, no cyanosis or edema  Assessment/Plan: 25 y.o.   POD# 2. 25                  Active Problems:   Postpartum care following cesarean delivery 10/21 Breastfeeding support Encourage to ambulate Routine post-op care   Cesarean delivery - repeat   Previous cesarean section   Anemia associated with acute blood  loss  Asymptomatic  Start PO Niferex and mag oxide  Anticipate discharge tomorrow.   11/21, CNM, MSN 04/04/2021, 11:09 AM

## 2021-04-04 NOTE — Lactation Note (Signed)
This note was copied from a baby's chart. Lactation Consultation Note  Patient Name: Taylor Rios YBWLS'L Date: 04/04/2021 Reason for consult: NICU baby;Term Age:24 hours  Attempted to visit with mom twice today, the first time in her room @ 404 and the second on in the NICU. This consult took place in the NICU, mom was trying to latch baby on, but he wasn't awake or showing any feeding cues.  Mom voiced she'd like to breastfeed if possible, explained to her the importance of protecting her supply by consistent pumping in order to get BF and her supply well established.   Assisted mom with hand expression and show parents how to finger feed but baby was still asleep and would not suck on a gloved finger. Reviewed normal newborn behavior, feeding cues, pumping schedule, benefits of STS care and lactogenesis II.  Plan of care:  Encouraged mom to start pumping consistently, at least 8 times/24 hours Mom will continue taking baby to breast STS on feeding cues for some "breast practice".  FOB present and supportive; but not interested in doing STS. All questions and concerns answered, parents to call NICU LC PRN.  Maternal Data   Mom's supply is BNL; she's at risk of delayed lactogenesis due to PP hemorrhage and infrequent pumping   Feeding Mother's Current Feeding Choice: Breast Milk and Donor Milk  Lactation Tools Discussed/Used Tools: Pump Breast pump type: Double-Electric Breast Pump Pump Education: Setup, frequency, and cleaning;Milk Storage Reason for Pumping: NICU stay Pumping frequency: 4 times/24 hours Pumped volume:  (drops)  Interventions Interventions: Breast feeding basics reviewed;Assisted with latch;Skin to skin;Breast massage;Hand express;DEBP;Support pillows  Discharge Pump: DEBP;Personal (Lansinoh at home)  Consult Status Consult Status: Follow-up Date: 04/04/21 Follow-up type: In-patient   [name] 04/04/2021, 4:51 PM

## 2021-04-05 NOTE — Clinical Social Work Maternal (Signed)
CLINICAL SOCIAL WORK MATERNAL/CHILD NOTE  Patient Details  Name: Taylor Rios MRN: [id number] Date of Birth: [birth year]  Date:  04/05/2021  Clinical Social Worker Initiating Note:  Laurey Arrow Date/Time: Initiated:  04/05/21/1235     Child's Name:  Taylor Rios   Biological Parents:  Mother, Father (FOB is Taylor Rios 11/06/1979)   Need for Interpreter:  None   Reason for Referral:  Behavioral Health Concerns (hx of anxiety)   Address:  [place] [address]    Phone number:  [phone] (home)     Additional phone number:   Household Members/Support Persons (HM/SP):   Household Member/Support Person 1   HM/SP Name Relationship DOB or Age  HM/SP -1 Taylor Rios son 03/09/2016  HM/SP -2        HM/SP -3        HM/SP -4        HM/SP -5        HM/SP -6        HM/SP -7        HM/SP -8          Natural Supports (not living in the home):  Extended Family, Parent, Immediate Family, Spouse/significant other   Professional Supports: None   Employment: Part-time   Type of Work: [employer]   Education:  [place] arranged:    Museum/gallery curator Resources:  [employer], Multimedia programmer    Other Resources:  Physicist, medical  , [place] Considerations Which May Impact Care:  Per [employer] Presenter, broadcasting, MOB is [employer].   Strengths:  Ability to meet basic needs  , Pediatrician chosen, Home prepared for child  , Understanding of illness, Psychotropic Medications   Psychotropic Medications:  Xanax (PRN)      Pediatrician:    [place]  Pediatrician List:   [facility] [place]      Pediatrician Fax Number:    Risk Factors/Current Problems:  Mental Health Concerns     Cognitive State:  Alert  , Insightful  , Able to Concentrate  , Goal Oriented  , Linear Thinking     Mood/Affect:  Calm  ,  Interested  , Comfortable  , Happy  , Relaxed     CSW Assessment: CSW met with MOB at infant's bedside in room 404.  When CSW arrived, MOB was resting in bed.  CSW explained CSW's role and invited MOB to ask questions during the clinical assessment. MOB was polite, easy to engage, and receptive to meeting with CSW.   CSW inquired about MOB's MH and MOB acknowledged a hx of anxiety and reported that she was dx during the Fall of 2020. Per MOB, she was prescribed Xanax and reported that takes her medication as needed; "Which is not that often."  CSW educated MOB about PMADs. CSW informed MOB of possible supports and interventions to decrease PPD.  CSW also encouraged MOB to seek medical attention if needed for increased signs and symptoms of PPD.  CSW also offered MOB resources for outpatient behavioral health services and MOB declined. CSW encouraged MOB to evaluate her mental health throughout the postpartum period with the use of the New Mom Checklist developed by Postpartum Progress and notify a medical professional if symptoms arise; MOB agreed. MOB presented with insight and awareness and denied SI, HI, and  DV when assessed for safety. MOB reported having a good support team that will be willing to help if needed. CSW reviewed safe sleep and SIDS. MOB communicated that MOB has everything she needs for the baby and is prepared to meet her infant's needs.  MOB did not have any further questions, concerns, or needs currently. CSW thanked MOB for allowing CSW to meet with her.  MOB reports feeling well informed by NICU team and denied having any questions or concerns about infant's care.   CSW will continue to offer resources and supports to family while infant remains in NICU.   CSW Plan/Description:  Psychosocial Support and Ongoing Assessment of Needs, Sudden Infant Death Syndrome (SIDS) Education, Perinatal Mood and Anxiety Disorder (PMADs) Education, Other Patient/Family Education, Other  Information/Referral to [employer], MSW, [employer] Clinical Social Work [phone]   Dimple Nanas, LCSW 04/05/2021, 12:39 PM

## 2021-04-05 NOTE — Progress Notes (Signed)
No sob/cp, no n/v; in pain today - not taking narcotic, afraid of constipation; says "hunched over" when walking d/t pain, feels also d/t constipation; worried for loss of support with her d/c home - asking to stay another night if insurance pays for it; no prob voiding +flatus, no bm for few days Baby in nicu, doing ok    Patient Vitals for the past 24 hrs:  BP Temp Temp src Pulse Resp SpO2  04/05/21 0553 115/69 98.3 F (36.8 C) Oral 83 18 97 %  04/04/21 2033 125/73 98 F (36.7 C) Oral 93 20 99 %  04/04/21 1542 117/77 98.4 F (36.9 C) -- 88 20 --   A&ox3; flat affect Rrr Ctab Abd: soft, nt, nd, +BS; dressing: in place; c/d/I Fundus: firm and below umb LE: no edema, nt bilat  CBC Latest Ref Rng & Units 04/03/2021 03/31/2021 02/02/2021  WBC 4.0 - 10.5 K/uL 10.0 8.1 5.1  Hemoglobin 12.0 - 15.0 g/dL 5.6(L) 11.5(L) 11.3(L)  Hematocrit 36.0 - 46.0 % 28.8(L) 35.7(L) 34.6(L)  Platelets 150 - 400 K/uL 142(L) 166 155   A/P: pod 3 s/p rltcs Doing well - d/c home tomorrow Anemia d/t acute blood loss - asymptomatic, iron daily pp Breastfeeding Infant/boy in nicu, plans circ when ready Rh pos RI Flat affect, anxious - SW has seen pt, concern for increased risk of pp depression; recommend 2 wk f/u

## 2021-04-05 NOTE — Lactation Note (Signed)
This note was copied from a baby's chart.  NICU Lactation Consultation Note  Patient Name: Taylor Rios [id number] Date: 04/05/2021 Age:25 years   Subjective Reason for consult: Breastfeeding assistance LC was called to the room to assist with bf'ing. Baby was crying and attempting to latch. LC observed intermittent panting and difficulty with obtaining latch. LC observed baby to have a frenulum to tip of tongue. FOB was present and recalled that he has a tongue tie. Latch improved with use of a nipple shield.   LC discontinued feeding and offered the baby a pacifier on mom's chest because of negative change in state after about 1 minute of bf'ing. RN was present. We encouraged mom to pump prior to bf'ing until SLP evaluates. LC referred to SLP.   Objective Infant data: Mother's Current Feeding Choice: Breast Milk   Infant feeding assessment: RR > 90, O2 78% with first milk letdown   Scale for Readiness: 2 Scale for Quality: 2  Maternal data: G2P2002  C-Section, Vacuum Assisted  Pumping frequency: at last pumping Pumped volume: 0 mL (drops)   Pump: DEBP; Personal (Lansinoh at home)   Assessment  Infant was unable to coordinate suck,swallow,breathe without a change in state. He should go to an empty breast until cleared by SLP for po feeding.  Infant has a visible lingual frenulum. He was able to latch using a nipple shield.    Maternal: Milk volume: Normal   Intervention/Plan Interventions: Pre-pump if needed  Tools: Nipple Dorris Carnes  Plan: Consult Status: Follow-up  NICU Follow-up type: Assist with IDF-1 (Mother to pre-pump before breastfeeding)    Elder Negus 04/05/2021, 24:57 PM

## 2021-04-05 NOTE — Lactation Note (Signed)
This note was copied from a baby's chart.  NICU Lactation Consultation Note  Patient Name: Taylor Rios [id number]'Y Date: 04/05/2021 Age:24 hours   Subjective Reason for consult: Follow-up assessment; Maternal discharge LC to mother's room for f/u visit. Mother's d/c will delay until tomorrow. She continues to pump frequently and with increasing volumes of milk. She has some breast fullness without engorgement. She continues to work on breast and bottle feedings.   Objective Infant data: Mother's Current Feeding Choice: Breast Milk  Infant feeding assessment Scale for Readiness: 2 Scale for Quality: 3   Maternal data: G2P2002  C-Section, Vacuum Assisted   Pumping frequency: at last pumping Pumped volume: 0 mL (drops)   Pump: DEBP; Personal (Lansinoh at home)   Assessment Infant: LATCH Score: 6   Maternal: Milk volume: Normal   Intervention/Plan Interventions: Education  Tools: Pump  Plan: Consult Status: Follow-up  NICU Follow-up type: Verify absence of engorgement; Assist with IDF-2 (Mother does not need to pre-pump before breastfeeding)  Mother to continue pumping q3. Mother to request LC f/u if she experiences s/s of engorgement.  LC to f/u with mom and baby in NICU for feeding assist prn.  Elder Negus 04/05/2021, 1:20 PM

## 2021-04-06 MED ORDER — IBUPROFEN 600 MG PO TABS: 600.0000 mg | ORAL_TABLET | Freq: Four times a day (QID) | ORAL | 0 refills | Status: DC

## 2021-04-06 MED ORDER — MAGNESIUM OXIDE -MG SUPPLEMENT 400 (240 MG) MG PO TABS: 400.0000 mg | ORAL_TABLET | Freq: Every day | ORAL | 1 refills | Status: DC

## 2021-04-06 MED ORDER — POLYSACCHARIDE IRON COMPLEX 150 MG PO CAPS: 150.0000 mg | ORAL_CAPSULE | Freq: Every day | ORAL | 1 refills | Status: DC

## 2021-04-06 MED ORDER — SENNOSIDES-DOCUSATE SODIUM 8.6-50 MG PO TABS: 2.0000 | ORAL_TABLET | Freq: Every day | ORAL | 1 refills | Status: DC

## 2021-04-06 MED ORDER — OXYCODONE-ACETAMINOPHEN 5-325 MG PO TABS: 1.0000 | ORAL_TABLET | ORAL | 0 refills | Status: DC | PRN

## 2021-04-06 NOTE — Lactation Note (Signed)
This note was copied from a baby's chart. Lactation Consultation Note  Patient Name: Taylor Rios [id number] Date: 04/06/2021 Reason for consult: Follow-up assessment;NICU baby;Term;Maternal discharge;Breastfeeding assistance Age:24 days  Visited with mom of 95 hours old FT NICU female, she requested a feeding assist. LC attempted to latch baby on cross cradle position to the right breast but he kept pulling away from the breast, when LC tried to gently bringing him back to breast again, he kept doing head bobbing and showing high tone/resistance to be positioned back to the breast.  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 Mom was frustrated because her first baby was breastfed and never had a problem with latch. Explained to mom that we can try different things and BF positions the next time when baby is ready.   Mom is going home, reviewed d/c instructions, engorgement prevention/treatment and sore nipples.   Plan of care:   Encouraged mom to start pumping consistently, at least 8 times/24 hours Mom will continue taking baby to breast STS on feeding cues, will use NS # 20 PRN   FOB present but asleep at this time. All questions and concerns answered, parents to call NICU LC PRN.  Maternal Data   Mom's supply is WNL   Feeding Mother's Current Feeding Choice: Breast Milk  Lactation Tools Discussed/Used Tools: Pump;Nipple Shields Nipple shield size: 20 Breast pump type: Double-Electric Breast Pump Pump Education: Setup, frequency, and cleaning;Milk Storage Reason for Pumping: NICU stay Pumping frequency: 6-7 times/24 hours Pumped volume: 70 mL  Interventions Interventions: Breast feeding basics reviewed;Assisted with latch;Skin to skin;Breast massage;Hand express;Breast compression;Adjust position;Support pillows;DEBP;Education  Discharge Discharge Education: Engorgement and breast  care Pump: DEBP;Personal (Lansinoh)  Consult Status Consult Status: Follow-up Date: 04/06/21 Follow-up type: In-patient   Taylor Rios 04/06/2021, 1:46 PM

## 2021-04-06 NOTE — Discharge Summary (Signed)
OB Discharge Summary  Patient Name: Taylor Rios DOB: 01-Jan-1996 MRN: [id number]  Date of admission: 04/02/2021 Delivering provider: Olivia Mackie   Admitting diagnosis: Previous cesarean section [Z98.891] Previous cesarean delivery affecting pregnancy [O34.219] Intrauterine pregnancy: [redacted]w[redacted]d     Secondary diagnosis: Patient Active Problem List   Diagnosis Date Noted   Anemia associated with acute blood loss 04/03/2021   Cesarean delivery - repeat 04/02/2021   Previous cesarean section 04/02/2021   Postpartum care following cesarean delivery 10/21 03/10/2016    Date of discharge: 04/06/2021   Discharge diagnosis: Active Problems:   Postpartum care following cesarean delivery 10/21   Cesarean delivery - repeat   Previous cesarean section   Anemia associated with acute blood loss                                                           Post partum procedures: None  Augmentation: N/A Pain control: Spinal  Laceration:None  Episiotomy:None  Complications: None  Hospital course:  Scheduled C/S   25 y.o. yo G2P2002 at [redacted]w[redacted]d was admitted to the hospital 04/02/2021 for scheduled cesarean section with the following indication:Elective Repeat.Delivery details are as follows:  Membrane Rupture Time/Date: 2:02 PM ,04/02/2021   Delivery Method:C-Section, Vacuum Assisted  Details of operation can be found in separate operative note.  Patient had an uncomplicated postpartum course.  She is ambulating, tolerating a regular diet, passing flatus, and urinating well. Patient is discharged home in stable condition on  04/06/21        Newborn Data: Birth date:04/02/2021  Birth time:2:04 PM  Gender:Female  Living status:Living  Apgars:7 ,8  Weight:     Physical exam  Vitals:   04/05/21 1421 04/05/21 2303 04/06/21 0105 04/06/21 0609  BP: 134/87 123/75 102/84 123/68  Pulse: 87 86 84 69  Resp: 18 20  18   Temp: 98.2 F (36.8 C) 98.3 F (36.8 C) 98 F (36.7 C) 98.4 F (36.9 C)   TempSrc: Oral Oral Oral Oral  SpO2: 100% 99% 100%   Weight:      Height:       General: alert, cooperative, and no distress Lochia: appropriate Uterine Fundus: firm Incision: Healing well with no significant drainage, Dressing is clean, dry, and intact DVT Evaluation: No evidence of DVT seen on physical exam.  Labs: Lab Results  Component Value Date   WBC 10.0 04/03/2021   HGB 9.2 (L) 04/03/2021   HCT 28.8 (L) 04/03/2021   MCV 88.1 04/03/2021   PLT 142 (L) 04/03/2021   CMP Latest Ref Rng & Units 02/02/2021  Glucose 70 - 99 mg/dL 93  BUN 6 - 20 mg/dL 7  Creatinine 02/04/2021 - 0.81 mg/dL 4.48  Sodium 1.85 - 631 mmol/L 136  Potassium 3.5 - 5.1 mmol/L 3.4(L)  Chloride 98 - 111 mmol/L 106  CO2 22 - 32 mmol/L 22  Calcium 8.9 - 10.3 mg/dL 9.1  Total Protein 6.5 - 8.1 g/dL 6.2(L)  Total Bilirubin 0.3 - 1.2 mg/dL 0.5  Alkaline Phos 38 - 126 U/L 76  AST 15 - 41 U/L 21  ALT 0 - 44 U/L 16   Edinburgh Postnatal Depression Scale Screening Tool 04/02/2021  I have been able to laugh and see the funny side of things. 0  I have looked forward with enjoyment to things. 0  I have blamed myself unnecessarily when things went wrong. 0  I have been anxious or worried for no good reason. 0  I have felt scared or panicky for no good reason. 0  Things have been getting on top of me. 0  I have been so unhappy that I have had difficulty sleeping. 0  I have felt sad or miserable. 0  I have been so unhappy that I have been crying. 0  The thought of harming myself has occurred to me. 0  Edinburgh Postnatal Depression Scale Total 0   Discharge instructions:  per After Visit Summary  After Visit Meds:  Allergies as of 04/06/2021       Reactions   Claritin [loratadine] Shortness Of Breath, Nausea Only, Swelling        Medication List     STOP taking these medications    ALPRAZolam 0.5 MG tablet Commonly known as: XANAX   melatonin 5 MG Tabs   ondansetron 8 MG disintegrating  tablet Commonly known as: Zofran ODT       TAKE these medications    ibuprofen 600 MG tablet Commonly known as: ADVIL Take 1 tablet (600 mg total) by mouth every 6 (six) hours.   iron polysaccharides 150 MG capsule Commonly known as: Ferrex 150 Take 1 capsule (150 mg total) by mouth daily.   magnesium oxide 400 (240 Mg) MG tablet Commonly known as: MAG-OX Take 1 tablet (400 mg total) by mouth daily.   oxyCODONE-acetaminophen 5-325 MG tablet Commonly known as: PERCOCET/ROXICET Take 1-2 tablets by mouth every 4 (four) hours as needed for moderate pain.   PRENATAL VITAMIN PO Take 1 tablet by mouth in the morning.   senna-docusate 8.6-50 MG tablet Commonly known as: Senokot-S Take 2 tablets by mouth daily.       Diet: routine diet  Activity: Advance as tolerated. Pelvic rest for 6 weeks.   Newborn Data: Live born female  Birth Weight:   APGAR: 7, 8  Newborn Delivery   Birth date/time: 04/02/2021 14:04:00 Delivery type: C-Section, Vacuum Assisted Trial of labor: No C-section categorization: Repeat     Named Kylen Baby Feeding: Bottle Disposition:NICU  Delivery Report:  Review the Delivery Report for details.    Follow up:  Follow-up Information     Olivia Mackie, MD. Schedule an appointment as soon as possible for a visit in 6 week(s).   Specialty: Obstetrics and Gynecology Contact information: [address] [place] [address] [phone]                Clancy Gourd, MSN 04/06/2021, 11:41 AM

## 2021-04-06 NOTE — Progress Notes (Signed)
Patient was unsure if she wanted any vaccines before DC.

## 2021-04-06 NOTE — Progress Notes (Signed)
Honeycomb dressing can come off 04/07/21 per Dorisann Frames.

## 2021-04-07 ENCOUNTER — Ambulatory Visit: Payer: Self-pay

## 2021-04-07 DIAGNOSIS — Z412 Encounter for routine and ritual male circumcision: Secondary | ICD-10-CM | POA: Diagnosis not present

## 2021-04-07 NOTE — Lactation Note (Signed)
This note was copied from a baby's chart.  NICU Lactation Consultation Note  Patient Name: Taylor Rios NIOEV'O Date: 04/07/2021 Age:24 days   Subjective Reason for consult: Other (Comment) (discharge) Infant to discharge today. Mother's milk is in. She denies pain or difficulty with pumping.   Infant is bottle feeding because of poor latch. Mother desires to bf.   Mother to f/u with Peds tomorrow about possible tongue tie and f/u with outpatient LC for bf assistance.   Objective Infant data: Mother's Current Feeding Choice: Breast Milk  Infant feeding assessment Scale for Readiness: 2 Scale for Quality: 2    Maternal data: G2P2002  C-Section, Vacuum Assisted   Pumping frequency: q3 with 90+mL average Pumped volume: 70 mL   Pump: DEBP; Personal (Lansinoh)   Assessment Infant: No data recorded Feeding Status: Ad lib   Maternal: Milk volume: Normal   Intervention/Plan Interventions: Education  Tools: Pump; Nipple Shields  Plan: Consult Status: Complete  F/u with outpatient LC  Elder Negus 04/07/2021, 5:15 PM

## 2021-04-13 ENCOUNTER — Telehealth (HOSPITAL_COMMUNITY): Payer: Self-pay | Admitting: *Deleted

## 2021-04-13 NOTE — Telephone Encounter (Signed)
Patient voiced no questions or concerns regarding her own health. EPDS = 0. Patient voiced no questions or concerns regarding baby at this time. Patient reported infant sleeps in a crib on his back. RN reviewed ABCs of safe sleep - patient verbalized understanding. Patient requested RN email information on hospital's virtual breastfeeding support groups. Email sent. Deforest Hoyles, RN, 04/13/21, [phone]

## 2021-04-29 ENCOUNTER — Other Ambulatory Visit: Payer: Self-pay | Admitting: *Deleted

## 2021-04-29 NOTE — Telephone Encounter (Signed)
Received call from patient.   Requested refill on Xanax.   Ok to refill??  Last office visit 04/08/2019.  Last refill 03/31/2021.  Of note, patient had pregnancy and delivered child on 04/02/2021.  Letter sent to patient to schedule appointment.

## 2021-05-05 NOTE — Telephone Encounter (Signed)
Pt called to check status of refill request. Pt advised of need for OV, appt scheduled for 05/14/21. Nothing further needed.

## 2021-05-14 ENCOUNTER — Ambulatory Visit (INDEPENDENT_AMBULATORY_CARE_PROVIDER_SITE_OTHER): Payer: BC Managed Care – PPO | Admitting: Family Medicine

## 2021-05-14 ENCOUNTER — Other Ambulatory Visit: Payer: Self-pay

## 2021-05-14 ENCOUNTER — Encounter: Payer: Self-pay | Admitting: Family Medicine

## 2021-05-14 VITALS — BP 118/78 | HR 74 | Resp 18 | Ht 67.0 in | Wt 264.0 lb

## 2021-05-14 DIAGNOSIS — F410 Panic disorder [episodic paroxysmal anxiety] without agoraphobia: Secondary | ICD-10-CM

## 2021-05-14 DIAGNOSIS — Z22330 Carrier of Group B streptococcus: Secondary | ICD-10-CM | POA: Insufficient documentation

## 2021-05-14 MED ORDER — ALPRAZOLAM 0.5 MG PO TABS: 0.5000 mg | ORAL_TABLET | Freq: Three times a day (TID) | ORAL | 0 refills | Status: DC | PRN
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
## 2021-05-17 DIAGNOSIS — Z124 Encounter for screening for malignant neoplasm of cervix: Secondary | ICD-10-CM | POA: Diagnosis not present

## 2021-08-10 DIAGNOSIS — Z20822 Contact with and (suspected) exposure to covid-19: Secondary | ICD-10-CM | POA: Diagnosis not present

## 2021-08-10 DIAGNOSIS — M791 Myalgia, unspecified site: Secondary | ICD-10-CM | POA: Diagnosis not present

## 2021-08-10 DIAGNOSIS — J069 Acute upper respiratory infection, unspecified: Secondary | ICD-10-CM | POA: Diagnosis not present

## 2021-08-10 DIAGNOSIS — R52 Pain, unspecified: Secondary | ICD-10-CM | POA: Diagnosis not present

## 2021-08-14 DIAGNOSIS — J0100 Acute maxillary sinusitis, unspecified: Secondary | ICD-10-CM | POA: Diagnosis not present

## 2021-08-26 DIAGNOSIS — K529 Noninfective gastroenteritis and colitis, unspecified: Secondary | ICD-10-CM | POA: Diagnosis not present

## 2021-09-06 ENCOUNTER — Emergency Department (HOSPITAL_BASED_OUTPATIENT_CLINIC_OR_DEPARTMENT_OTHER)
Admission: EM | Admit: 2021-09-06 | Discharge: 2021-09-06 | Disposition: A | Discharge status: Home / Self Care | Payer: BC Managed Care – PPO | Attending: Emergency Medicine | Admitting: Emergency Medicine

## 2021-09-06 ENCOUNTER — Other Ambulatory Visit: Payer: Self-pay

## 2021-09-06 ENCOUNTER — Emergency Department (HOSPITAL_BASED_OUTPATIENT_CLINIC_OR_DEPARTMENT_OTHER): Payer: BC Managed Care – PPO | Admitting: Radiology

## 2021-09-06 ENCOUNTER — Encounter (HOSPITAL_BASED_OUTPATIENT_CLINIC_OR_DEPARTMENT_OTHER): Payer: Self-pay

## 2021-09-06 DIAGNOSIS — M7989 Other specified soft tissue disorders: Secondary | ICD-10-CM | POA: Diagnosis not present

## 2021-09-06 DIAGNOSIS — W2203XA Walked into furniture, initial encounter: Secondary | ICD-10-CM | POA: Insufficient documentation

## 2021-09-06 DIAGNOSIS — S92515A Nondisplaced fracture of proximal phalanx of left lesser toe(s), initial encounter for closed fracture: Secondary | ICD-10-CM | POA: Insufficient documentation

## 2021-09-06 DIAGNOSIS — S99922A Unspecified injury of left foot, initial encounter: Secondary | ICD-10-CM | POA: Diagnosis not present

## 2021-09-06 DIAGNOSIS — E039 Hypothyroidism, unspecified: Secondary | ICD-10-CM | POA: Insufficient documentation

## 2021-09-06 MED ORDER — IBUPROFEN 800 MG PO TABS
800.0000 mg | ORAL_TABLET | Freq: Once | ORAL | Status: AC
  Administered 2021-09-06: 800 mg via ORAL
  Filled 2021-09-06: qty 1

## 2021-09-06 MED ORDER — ACETAMINOPHEN 325 MG PO TABS: 650.0000 mg | ORAL_TABLET | Freq: Four times a day (QID) | ORAL | 0 refills | Status: DC | PRN

## 2021-09-06 MED ORDER — IBUPROFEN 800 MG PO TABS: 800.0000 mg | ORAL_TABLET | Freq: Three times a day (TID) | ORAL | 0 refills | Status: DC

## 2021-09-06 NOTE — ED Triage Notes (Signed)
Pt arrives POV. ? ?She hit her 4th toe on left foot in the doorway around 1 pm today. ? ?Since then she has had pain, swelling and limited mobility. ? ?Slight deformity noted to toe. ? ?States she took a Percocet earlier for pain. ?

## 2021-09-06 NOTE — ED Provider Notes (Signed)
?[facility] EMERGENCY DEPT ?Provider Note ? ? ?CSN: [id number] ?Arrival date & time: 09/06/21  1741 ? ?  ? ?History ? ?Chief Complaint  ?Patient presents with  ? Toe Pain  ? ? ?Taylor Rios is a 26 y.o. female. ? ?This is a 26 y.o. female with significant medical history as below, including ADHD, anemia, anxiety who presents to the ED with complaint of left toe injury ? ?Around 1:00 this afternoon patient was walking in her home, she excellently kicked a door frame.  Experienced immediate pain to left toe.  Difficulty walking secondary to discomfort.  No other acute injuries.  Did not fall down.  No ankle or knee pain.  No hip pain.  She took leftover Percocet and did feel better. ? ?No nausea or vomiting. ?No other acute complaints offered ? ? ?Past Medical History: ?No date: ADHD (attention deficit hyperactivity disorder) ?No date: Anemia ?No date: Anxiety ?No date: Hx of chlamydia infection ?No date: Hx of gonorrhea ?No date: Subclinical hypothyroidism ? ?Past Surgical History: ?03/09/2016: CESAREAN SECTION; N/A ?    Comment:  Procedure: CESAREAN SECTION;  Surgeon: Olivia Mackie,  ?             MD;  Location: [facility];  Service: Obstetrics;  ?             Laterality: N/A; ?04/02/2021: CESAREAN SECTION; N/A ?    Comment:  Procedure: Repeat CESAREAN SECTION;  Surgeon: Billy Coast,  ?             Richard, MD;  Location: [facility];  Service: Obstetrics;  ?             Laterality: N/A;  EDD: 04/06/21  ? ? ?The history is provided by the patient. No language interpreter was used.  ?Toe Pain ?Pertinent negatives include no chest pain, no abdominal pain, no headaches and no shortness of breath.  ? ?  ? ?Home Medications ?Prior to Admission medications   ?Medication Sig Start Date End Date Taking? Authorizing Provider  ?acetaminophen (TYLENOL) 325 MG tablet Take 2 tablets (650 mg total) by mouth every 6 (six) hours as needed. 09/06/21  Yes Tanda Rockers A, DO  ?ibuprofen (ADVIL) 800 MG tablet Take 1  tablet (800 mg total) by mouth 3 (three) times daily. Take with food 09/06/21  Yes Sloan Leiter, DO  ?ALPRAZolam (XANAX) 0.5 MG tablet Take 1 tablet (0.5 mg total) by mouth 3 (three) times daily as needed. 05/14/21   Donita Brooks, MD  ?iron polysaccharides (FERREX 150) 150 MG capsule Take 1 capsule (150 mg total) by mouth daily. ?Patient not taking: Reported on 05/14/2021 04/06/21   June Leap, CNM  ?magnesium oxide (MAG-OX) 400 (240 Mg) MG tablet Take 1 tablet (400 mg total) by mouth daily. ?Patient not taking: Reported on 05/14/2021 04/06/21   June Leap, CNM  ?Prenatal Vit-Fe Fumarate-FA (PRENATAL VITAMIN PO) Take 1 tablet by mouth in the morning. ?Patient not taking: Reported on 05/14/2021    [provider]  ?   ? ?Allergies    ?Claritin [loratadine]   ? ?Review of Systems   ?Review of Systems  ?Constitutional:  Negative for activity change and fever.  ?HENT:  Negative for facial swelling and trouble swallowing.   ?Eyes:  Negative for discharge and redness.  ?Respiratory:  Negative for cough and shortness of breath.   ?Cardiovascular:  Negative for chest pain and palpitations.  ?Gastrointestinal:  Negative for abdominal pain and nausea.  ?  Genitourinary:  Negative for dysuria and flank pain.  ?Musculoskeletal:  Positive for arthralgias and gait problem. Negative for back pain.  ?Skin:  Negative for pallor and rash.  ?Neurological:  Negative for syncope and headaches.  ? ?Physical Exam ?Updated Vital Signs ?BP (!) 136/92 (BP Location: Right Arm)   Pulse 86   Temp 98.5 ?F (36.9 ?C)   Resp 18   Ht 5\' 7"  (1.702 m)   Wt 104.3 kg   LMP 08/22/2021 (Exact Date)   SpO2 98%   Breastfeeding Yes   BMI 36.02 kg/m?  ?Physical Exam ?Vitals and nursing note reviewed.  ?Constitutional:   ?   General: She is not in acute distress. ?   Appearance: Normal appearance.  ?HENT:  ?   Head: Normocephalic and atraumatic.  ?   Right Ear: External ear normal.  ?   Left Ear: External ear normal.  ?   Nose: Nose  normal.  ?   Mouth/Throat:  ?   Mouth: Mucous membranes are moist.  ?Eyes:  ?   General: No scleral icterus.    ?   Right eye: No discharge.     ?   Left eye: No discharge.  ?Cardiovascular:  ?   Rate and Rhythm: Normal rate and regular rhythm.  ?   Pulses: Normal pulses.  ?   Heart sounds: Normal heart sounds.  ?Pulmonary:  ?   Effort: Pulmonary effort is normal. No respiratory distress.  ?   Breath sounds: Normal breath sounds.  ?Abdominal:  ?   General: Abdomen is flat.  ?   Tenderness: There is no abdominal tenderness.  ?Musculoskeletal:     ?   General: Normal range of motion.  ?   Cervical back: Normal range of motion.  ?   Right lower leg: No edema.  ?   Left lower leg: No edema.  ?     Feet: ? ?Feet:  ?   Comments: Bruising and tenderness at left fourth toe. ?DP pulses equal symmetric bilateral ?Mild reduced range of motion to left lesser toe secondary to discomfort.  Capillary fill is brisk.  Nailbed intact.  No bleeding. ? ?No pain to ipsilateral ankle or knee. ?Skin: ?   General: Skin is warm and dry.  ?   Capillary Refill: Capillary refill takes less than 2 seconds.  ?Neurological:  ?   Mental Status: She is alert.  ?Psychiatric:     ?   Mood and Affect: Mood normal.     ?   Behavior: Behavior normal.  ? ? ?ED Results / Procedures / Treatments   ?Labs ?(all labs ordered are listed, but only abnormal results are displayed) ?Labs Reviewed - No data to display ? ?EKG ?None ? ?Radiology ?DG Foot 2 Views Left ? ?Result Date: 09/06/2021 ?CLINICAL DATA:  Injury to left fourth toe. Hit in door way today. Pain and swelling. EXAM: LEFT FOOT - 2 VIEW COMPARISON:  None. FINDINGS: Normal bone mineralization. There is a subtle oblique linear lucency within the proximal shaft of the proximal phalanx of fourth toe suspicious for nondisplaced fracture. This is only seen on frontal view due to overlapping bones on lateral view. Joint spaces are preserved. No acute fracture or dislocation. IMPRESSION: Likely nondisplaced  acute fracture of the proximal shaft of the proximal phalanx of the fourth toe. Electronically Signed   By: Neita Garnetonald  Viola M.D.   On: 09/06/2021 18:30   ? ?Procedures ?Procedures  ? ? ?Medications Ordered in ED ?Medications  ?  ibuprofen (ADVIL) tablet 800 mg (800 mg Oral Given 09/06/21 2126)  ? ? ?ED Course/ Medical Decision Making/ A&P ?Clinical Course as of 09/07/21 0007  ?Mon Sep 06, 2021  ?2051 IMPRESSION: ?Likely nondisplaced acute fracture of the proximal shaft of the ?proximal phalanx of the fourth toe. ?  ? ? [SG]  ?  ?Clinical Course User Index ?[SG] Tanda Rockers A, DO  ? ?                        ?Medical Decision Making ?Amount and/or Complexity of Data Reviewed ?Radiology: ordered. ? ?Risk ?OTC drugs. ?Prescription drug management. ? ? ?Initial Impression and Ddx ?This patient presents to the Emergency Department for the above complaint. This involves an extensive number of treatment options and is a complaint that carries with it a high risk of complications and morbidity. Vital signs were reviewed.  ? ?Serious etiologies considered. Ddx includes but is not limited to: Fracture, sprain, strain ? ?Previous records obtained and reviewed  ? ?Interpretation of Diagnostics ?Labs & imaging results that were available during my care of the patient were visualized by me and considered in my medical decision making. ?  ?I ordered imaging studies which included foot x-ray and I visualized the imaging and I agree with radiologist interpretation.  Nondisplaced fracture of proximal phalanx fourth toe ? ?PDMP reviewed  ? ? ?Patient Reassessment and Ultimate Disposition/Management ? ? ?Clinical Course as of 09/07/21 0007  ?Mon Sep 06, 2021  ?2051 IMPRESSION: ?Likely nondisplaced acute fracture of the proximal shaft of the ?proximal phalanx of the fourth toe. ?  ? ? [SG]  ?  ?Clinical Course User Index ?[SG] Tanda Rockers A, DO  ? ? ? ? ?Patient with fracture.  Nondisplaced.  Will buddy tape, place in postop shoe.  She is  ambulatory.  She is weightbearing without significant discomfort. ? ?Lower extremities remain neurovascularly intact ? ?Follow-up with Ortho ? ?The patient improved significantly and was discharged in stable con

## 2021-09-06 NOTE — Discharge Instructions (Addendum)
It was a pleasure caring for you today in the emergency department. ° °Please return to the emergency department for any worsening or worrisome symptoms. ° ° °

## 2021-09-09 ENCOUNTER — Telehealth: Payer: Self-pay

## 2021-09-09 DIAGNOSIS — M79672 Pain in left foot: Secondary | ICD-10-CM | POA: Diagnosis not present

## 2021-09-09 DIAGNOSIS — S92515A Nondisplaced fracture of proximal phalanx of left lesser toe(s), initial encounter for closed fracture: Secondary | ICD-10-CM | POA: Diagnosis not present

## 2021-09-09 NOTE — Telephone Encounter (Signed)
Pt called to 09/08/21 to let Dr. Dennard Schaumann know that she had broke her foot. She went to the ER at [facility] on [address] per her Ortho Dr.'s orders.  ? ?09/08/21 ?

## 2021-10-21 DIAGNOSIS — M79672 Pain in left foot: Secondary | ICD-10-CM | POA: Diagnosis not present

## 2021-10-21 DIAGNOSIS — S92515A Nondisplaced fracture of proximal phalanx of left lesser toe(s), initial encounter for closed fracture: Secondary | ICD-10-CM | POA: Diagnosis not present

## 2021-12-02 ENCOUNTER — Other Ambulatory Visit: Payer: Self-pay

## 2021-12-02 DIAGNOSIS — S92515A Nondisplaced fracture of proximal phalanx of left lesser toe(s), initial encounter for closed fracture: Secondary | ICD-10-CM | POA: Diagnosis not present

## 2021-12-02 MED ORDER — ALPRAZOLAM 0.5 MG PO TABS: 0.5000 mg | ORAL_TABLET | Freq: Three times a day (TID) | ORAL | 0 refills | Status: DC | PRN

## 2021-12-02 NOTE — Telephone Encounter (Signed)
LOV 05/14/21 Last refill 05/14/21, #30, 0 refills  Please review, thanks!

## 2022-03-30 ENCOUNTER — Telehealth: Payer: Self-pay

## 2022-03-30 NOTE — Telephone Encounter (Signed)
Pt called requesting a refill on her Xanax. Last RF was 12/02/2021. Last OV was 05/14/2021. Thank you.

## 2022-03-31 ENCOUNTER — Other Ambulatory Visit: Payer: Self-pay | Admitting: Family Medicine

## 2022-03-31 MED ORDER — ALPRAZOLAM 0.5 MG PO TABS: 0.5000 mg | ORAL_TABLET | Freq: Three times a day (TID) | ORAL | 0 refills | Status: DC | PRN

## 2022-06-07 ENCOUNTER — Ambulatory Visit (INDEPENDENT_AMBULATORY_CARE_PROVIDER_SITE_OTHER): Payer: Medicaid Other | Admitting: Family Medicine

## 2022-06-07 ENCOUNTER — Encounter: Payer: Self-pay | Admitting: Family Medicine

## 2022-06-07 VITALS — BP 124/76 | HR 90 | Temp 98.5°F | Ht 67.0 in | Wt 251.0 lb

## 2022-06-07 DIAGNOSIS — J029 Acute pharyngitis, unspecified: Secondary | ICD-10-CM | POA: Diagnosis not present

## 2022-06-07 LAB — INFLUENZA A AND B AG, IMMUNOASSAY
INFLUENZA A ANTIGEN: NOT DETECTED
INFLUENZA B ANTIGEN: NOT DETECTED

## 2022-06-07 MED ORDER — AMOXICILLIN 875 MG PO TABS: 875.0000 mg | ORAL_TABLET | Freq: Two times a day (BID) | ORAL | 0 refills | Status: AC

## 2022-06-07 NOTE — Progress Notes (Signed)
Subjective:    Patient ID: Taylor Rios, female    DOB: [birth year], 26 y.o.   MRN: [id number]  Sore Throat     Patient is a 26 year old Caucasian female who presents today with cough, headache, and severe sore throat.  Symptoms began yesterday.  She started coughing last night and this morning she had a severe sore throat.  On examination today, her tonsils are swollen and erythematous with posterior oropharyngeal erythema.  She is tender but not in the neck.  She took a COVID test at home that was negative.  She denies any known exposure to flu or strep throat.  She denies any chest pain shortness of breath or dyspnea on exertion. Past Medical History:  Diagnosis Date   ADHD (attention deficit hyperactivity disorder)    Anemia    Anxiety    Hx of chlamydia infection    Hx of gonorrhea    Subclinical hypothyroidism    Past Surgical History:  Procedure Laterality Date   CESAREAN SECTION N/A 03/09/2016   Procedure: CESAREAN SECTION;  Surgeon: Olivia Mackie, MD;  Location: [facility]ING SU[facility];  Service: Obstetrics;  Laterality: N/A;   CESAREAN SECTION N/A 04/02/2021   Procedure: Repeat CESAREAN SECTION;  Surgeon: Olivia Mackie, MD;  Location: [facility];  Service: Obstetrics;  Laterality: N/A;  EDD: 04/06/21   Current Outpatient Medications on File Prior to Visit  Medication Sig Dispense Refill   acetaminophen (TYLENOL) 325 MG tablet Take 2 tablets (650 mg total) by mouth every 6 (six) hours as needed. 36 tablet 0   ALPRAZolam (XANAX) 0.5 MG tablet Take 1 tablet (0.5 mg total) by mouth 3 (three) times daily as needed. 30 tablet 0   ibuprofen (ADVIL) 800 MG tablet Take 1 tablet (800 mg total) by mouth 3 (three) times daily. Take with food 21 tablet 0   iron polysaccharides (FERREX 150) 150 MG capsule Take 1 capsule (150 mg total) by mouth daily. (Patient not taking: Reported on 05/14/2021) 30 capsule 1   magnesium oxide (MAG-OX) 400 (240 Mg) MG tablet Take 1 tablet (400 mg total) by  mouth daily. (Patient not taking: Reported on 05/14/2021) 30 tablet 1   Prenatal Vit-Fe Fumarate-FA (PRENATAL VITAMIN PO) Take 1 tablet by mouth in the morning. (Patient not taking: Reported on 05/14/2021)     No current facility-administered medications on file prior to visit.   Allergies  Allergen Reactions   Claritin [Loratadine] Shortness Of Breath, Nausea Only and Swelling   Social History   Socioeconomic History   Marital status: Single    Spouse name: Not on file   Number of children: Not on file   Years of education: Not on file   Highest education level: Not on file  Occupational History   Not on file  Tobacco Use   Smoking status: Never   Smokeless tobacco: Never  Vaping Use   Vaping Use: Never used  Substance and Sexual Activity   Alcohol use: Yes    Comment: occasionally   Drug use: No   Sexual activity: Yes    Birth control/protection: Condom  Other Topics Concern   Not on file  Social History Narrative   Not on file   Social Determinants of Health   Financial Resource Strain: Not on file  Food Insecurity: Not on file  Transportation Needs: Not on file  Physical Activity: Not on file  Stress: Not on file  Social Connections: Not on file  Intimate Partner Violence: Not on file  Review of Systems  All other systems reviewed and are negative.      Objective:   Physical Exam Vitals reviewed.  Constitutional:      General: She is not in acute distress.    Appearance: She is well-developed. She is obese. She is not ill-appearing or toxic-appearing.  HENT:     Right Ear: Tympanic membrane and ear canal normal.     Left Ear: Tympanic membrane and ear canal normal.     Nose: No congestion or rhinorrhea.     Mouth/Throat:     Pharynx: Posterior oropharyngeal erythema present. No oropharyngeal exudate.     Tonsils: No tonsillar exudate. 2+ on the right. 2+ on the left.  Cardiovascular:     Rate and Rhythm: Normal rate.  Neurological:     Mental  Status: She is alert.           Assessment & Plan:  Sore throat - Plan: STREP GROUP A AG, W/REFLEX TO CULT Strep and flu test were both negative.  Patient has a viral upper respiratory infection.  Recommended tincture of time and supportive care using ibuprofen and Chloraseptic.  Given the severity of the tonsillar swelling and erythema I did give her amoxicillin.  If getting worse, I would treat for tonsillitis with amoxicillin 875 mg twice daily for 10 days.  She knows not to take the antibiotic for at least another 2 to 3 days and only if symptoms are worsening

## 2022-06-07 NOTE — Addendum Note (Signed)
Addended by: Venia Carbon K on: 06/07/2022 04:29 PM   Modules accepted: Orders

## 2022-06-09 LAB — CULTURE, GROUP A STREP
MICRO NUMBER:: 14356706
SPECIMEN QUALITY:: ADEQUATE

## 2022-06-09 LAB — STREP GROUP A AG, W/REFLEX TO CULT: Streptococcus Group A AG: NOT DETECTED

## 2022-06-27 ENCOUNTER — Other Ambulatory Visit: Payer: Self-pay | Admitting: Family Medicine

## 2022-06-27 ENCOUNTER — Telehealth: Payer: Self-pay

## 2022-06-27 MED ORDER — ALPRAZOLAM 0.5 MG PO TABS: 0.5000 mg | ORAL_TABLET | Freq: Three times a day (TID) | ORAL | 0 refills | Status: DC | PRN

## 2022-06-27 NOTE — Telephone Encounter (Signed)
Pt called and states she is in need of a refill on her Xanax. Last RF was 03/31/2022. Last OV was 06/07/2022. Thank you.

## 2022-08-11 ENCOUNTER — Telehealth: Payer: Self-pay

## 2022-08-11 NOTE — Telephone Encounter (Signed)
Pt called requesting a refill on her Xanax. Last RF was 06/27/2022. Last visit to follow up on Xanax was 05/14/2021. My chart message sent to pt to schedule appointment. Thanks.

## 2022-08-16 ENCOUNTER — Ambulatory Visit (INDEPENDENT_AMBULATORY_CARE_PROVIDER_SITE_OTHER): Payer: Medicaid Other | Admitting: Family Medicine

## 2022-08-16 VITALS — BP 114/76 | HR 74 | Temp 98.6°F | Ht 67.0 in | Wt 229.0 lb

## 2022-08-16 DIAGNOSIS — J029 Acute pharyngitis, unspecified: Secondary | ICD-10-CM | POA: Diagnosis not present

## 2022-08-16 DIAGNOSIS — F410 Panic disorder [episodic paroxysmal anxiety] without agoraphobia: Secondary | ICD-10-CM | POA: Diagnosis not present

## 2022-08-16 MED ORDER — ALPRAZOLAM 0.5 MG PO TABS: 0.5000 mg | ORAL_TABLET | Freq: Three times a day (TID) | ORAL | 0 refills | Status: DC | PRN

## 2022-08-16 MED ORDER — HYDROXYZINE PAMOATE 25 MG PO CAPS
25.0000 mg | ORAL_CAPSULE | Freq: Three times a day (TID) | ORAL | 0 refills | Status: DC | PRN
Start: 2022-08-16 — End: 2023-08-30

## 2022-08-16 MED ORDER — AMOXICILLIN 875 MG PO TABS: 875.0000 mg | ORAL_TABLET | Freq: Two times a day (BID) | ORAL | 0 refills | Status: AC
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
## 2022-08-18 ENCOUNTER — Ambulatory Visit: Payer: Medicaid Other | Admitting: Family Medicine

## 2022-08-18 LAB — CULTURE, GROUP A STREP
MICRO NUMBER:: 14650579
SPECIMEN QUALITY:: ADEQUATE

## 2022-08-18 LAB — STREP GROUP A AG, W/REFLEX TO CULT: Streptococcus Group A AG: NOT DETECTED

## 2022-09-05 ENCOUNTER — Ambulatory Visit (INDEPENDENT_AMBULATORY_CARE_PROVIDER_SITE_OTHER): Payer: Medicaid Other | Admitting: Family Medicine

## 2022-09-05 ENCOUNTER — Encounter: Payer: Self-pay | Admitting: Family Medicine

## 2022-09-05 VITALS — BP 124/68 | HR 93 | Temp 98.2°F | Ht 67.0 in | Wt 218.0 lb

## 2022-09-05 DIAGNOSIS — J4520 Mild intermittent asthma, uncomplicated: Secondary | ICD-10-CM

## 2022-09-05 MED ORDER — ALBUTEROL SULFATE HFA 108 (90 BASE) MCG/ACT IN AERS: 2.0000 | INHALATION_SPRAY | Freq: Four times a day (QID) | RESPIRATORY_TRACT | 0 refills | Status: DC | PRN

## 2022-09-05 MED ORDER — PREDNISONE 20 MG PO TABS: ORAL_TABLET | ORAL | 0 refills | Status: DC

## 2022-09-05 NOTE — Progress Notes (Signed)
Subjective:    Patient ID: Taylor Rios, female    DOB: 1995-07-09, 27 y.o.   MRN: [id number]  Cough  Patient had asthma when she was a child.  She has not needed her rescue inhaler in more than 2 decades.  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  Shortly thereafter she woke up 1 evening coughing and wheezing and she was unable to catch her breath.  She used her son's inhaler.  She also had a panic attack because she could not catch her breath.  Therefore she believes that the shortness of breath is partly due to wheezing and probably due to panic.  She denies any laryngospasm or acid reflux.  She states this occurred each night for the last 3 nights.  Is been gradually getting better.  She does report cough and chest congestion but denies fever Past Medical History:  Diagnosis Date   ADHD (attention deficit hyperactivity disorder)    Anemia    Anxiety    Hx of chlamydia infection    Hx of gonorrhea    Subclinical hypothyroidism    Past Surgical History:  Procedure Laterality Date   CESAREAN SECTION N/A 03/09/2016   Procedure: CESAREAN SECTION;  Surgeon: Brien Few, MD;  Location: [facility];  Service: Obstetrics;  Laterality: N/A;   CESAREAN SECTION N/A 04/02/2021   Procedure: Repeat CESAREAN SECTION;  Surgeon: Brien Few, MD;  Location: [facility];  Service: Obstetrics;  Laterality: N/A;  EDD: 04/06/21   Current Outpatient Medications on File Prior to Visit  Medication Sig Dispense Refill   acetaminophen (TYLENOL) 325 MG tablet Take 2 tablets (650 mg total) by mouth every 6 (six) hours as needed. 36 tablet 0   ALPRAZolam (XANAX) 0.5 MG tablet Take 1 tablet (0.5 mg total) by mouth 3 (three) times daily as needed. 30 tablet 0   hydrOXYzine (VISTARIL) 25 MG capsule Take 1 capsule (25 mg total) by mouth every 8 (eight) hours as needed for anxiety. 30 capsule 0   ibuprofen (ADVIL) 800 MG tablet Take 1  tablet (800 mg total) by mouth 3 (three) times daily. Take with food 21 tablet 0   No current facility-administered medications on file prior to visit.   Allergies  Allergen Reactions   Claritin [Loratadine] Shortness Of Breath, Nausea Only and Swelling   Social History   Socioeconomic History   Marital status: Single    Spouse name: Not on file   Number of children: Not on file   Years of education: Not on file   Highest education level: Not on file  Occupational History   Not on file  Tobacco Use   Smoking status: Never   Smokeless tobacco: Never  Vaping Use   Vaping Use: Never used  Substance and Sexual Activity   Alcohol use: Yes    Comment: occasionally   Drug use: No   Sexual activity: Yes    Birth control/protection: Condom  Other Topics Concern   Not on file  Social History Narrative   Not on file   Social Determinants of Health   Financial Resource Strain: Not on file  Food Insecurity: Not on file  Transportation Needs: Not on file  Physical Activity: Not on file  Stress: Not on file  Social Connections: Not on file  Intimate Partner Violence: Not on file     Review of Systems  Respiratory:  Positive for cough.  All other systems reviewed and are negative.      Objective:   Physical Exam Vitals reviewed.  Constitutional:      General: She is not in acute distress.    Appearance: Normal appearance. She is normal weight. She is not ill-appearing or toxic-appearing.  Cardiovascular:     Rate and Rhythm: Normal rate and regular rhythm.     Pulses: Normal pulses.     Heart sounds: Normal heart sounds. No murmur heard.    No friction rub. No gallop.  Pulmonary:     Effort: Pulmonary effort is normal. No respiratory distress.     Breath sounds: No stridor. Wheezing present. No rales.  Neurological:     General: No focal deficit present.     Mental Status: She is alert and oriented to person, place, and time.     Cranial Nerves: No cranial nerve  deficit.     Motor: No weakness.     Coordination: Coordination normal.  Psychiatric:        Mood and Affect: Mood normal.        Behavior: Behavior normal.        Thought Content: Thought content normal.        Judgment: Judgment normal.        Assessment & Plan:  RAD (reactive airway disease) with wheezing, mild intermittent, uncomplicated I believe the patient had a mild asthma attack related to upper respiratory infection.  Begin a prednisone taper pack and use albuterol 2 puffs every 4-6 hours as needed for wheezing.  If recurrent consider adding an inhaled steroid for prevention.  Also believe that anxiety played a role.  Symptoms do not sound consistent with laryngospasm or sleep apnea

## 2022-10-25 ENCOUNTER — Encounter: Payer: Self-pay | Admitting: Family Medicine

## 2022-10-25 ENCOUNTER — Ambulatory Visit (INDEPENDENT_AMBULATORY_CARE_PROVIDER_SITE_OTHER): Payer: Medicaid Other | Admitting: Family Medicine

## 2022-10-25 VITALS — BP 100/64 | HR 96 | Temp 98.7°F | Ht 67.0 in | Wt 221.0 lb

## 2022-10-25 DIAGNOSIS — J069 Acute upper respiratory infection, unspecified: Secondary | ICD-10-CM

## 2022-10-25 MED ORDER — ALBUTEROL SULFATE HFA 108 (90 BASE) MCG/ACT IN AERS: 2.0000 | INHALATION_SPRAY | Freq: Four times a day (QID) | RESPIRATORY_TRACT | 0 refills | Status: DC | PRN

## 2022-10-25 MED ORDER — HYDROCODONE BIT-HOMATROP MBR 5-1.5 MG/5ML PO SOLN: 5.0000 mL | Freq: Three times a day (TID) | ORAL | 0 refills | Status: DC | PRN

## 2022-10-25 NOTE — Assessment & Plan Note (Signed)

## 2022-10-25 NOTE — Patient Instructions (Signed)

## 2022-10-25 NOTE — Progress Notes (Signed)
Acute Office Visit  Subjective:     Patient ID: Taylor Rios, female    DOB: May 14, 1996, 27 y.o.   MRN: [id number]  Chief Complaint  Patient presents with   Follow-up    terrible cough/feels like a ball at back of throat/wheezing      HPI Patient is in today for 2 days of mucopurulent cough, congestion, runny nose, body aches, fatigue, wheezing. Denies fever, shortness of breath, pleurisy. She does work at a daycare. No covid test. Has tried Nyquil.  Review of Systems  All other systems reviewed and are negative.   Past Medical History:  Diagnosis Date   ADHD (attention deficit hyperactivity disorder)    Anemia    Anxiety    Hx of chlamydia infection    Hx of gonorrhea    Subclinical hypothyroidism    Past Surgical History:  Procedure Laterality Date   CESAREAN SECTION N/A 03/09/2016   Procedure: CESAREAN SECTION;  Surgeon: Olivia Mackie, MD;  Location: [facility];  Service: Obstetrics;  Laterality: N/A;   CESAREAN SECTION N/A 04/02/2021   Procedure: Repeat CESAREAN SECTION;  Surgeon: Olivia Mackie, MD;  Location: [facility];  Service: Obstetrics;  Laterality: N/A;  EDD: 04/06/21   Current Outpatient Medications on File Prior to Visit  Medication Sig Dispense Refill   ALPRAZolam (XANAX) 0.5 MG tablet Take 1 tablet (0.5 mg total) by mouth 3 (three) times daily as needed. 30 tablet 0   hydrOXYzine (VISTARIL) 25 MG capsule Take 1 capsule (25 mg total) by mouth every 8 (eight) hours as needed for anxiety. 30 capsule 0   acetaminophen (TYLENOL) 325 MG tablet Take 2 tablets (650 mg total) by mouth every 6 (six) hours as needed. (Patient not taking: Reported on 10/25/2022) 36 tablet 0   ibuprofen (ADVIL) 800 MG tablet Take 1 tablet (800 mg total) by mouth 3 (three) times daily. Take with food (Patient not taking: Reported on 10/25/2022) 21 tablet 0   predniSONE (DELTASONE) 20 MG tablet 3 tabs poqday 1-2, 2 tabs poqday 3-4, 1 tab poqday 5-6 (Patient not taking:  Reported on 10/25/2022) 12 tablet 0   No current facility-administered medications on file prior to visit.   Allergies  Allergen Reactions   Claritin [Loratadine] Shortness Of Breath, Nausea Only and Swelling       Objective:    BP 100/64   Pulse 96   Temp 98.7 F (37.1 C) (Oral)   Ht 5\' 7"  (1.702 m)   Wt 221 lb (100.2 kg)   LMP 10/07/2022 (Approximate)   SpO2 98%   BMI 34.61 kg/m    Physical Exam Vitals and nursing note reviewed.  Constitutional:      Appearance: Normal appearance. She is normal weight.  HENT:     Head: Normocephalic and atraumatic.     Right Ear: Tympanic membrane, ear canal and external ear normal.     Left Ear: Tympanic membrane, ear canal and external ear normal.     Nose: Nose normal.     Mouth/Throat:     Pharynx: Oropharynx is clear.  Eyes:     Conjunctiva/sclera: Conjunctivae normal.  Cardiovascular:     Rate and Rhythm: Normal rate and regular rhythm.     Pulses: Normal pulses.     Heart sounds: Normal heart sounds.  Pulmonary:     Effort: Pulmonary effort is normal.     Breath sounds: Wheezing present.  Musculoskeletal:     Cervical back: No tenderness.  Lymphadenopathy:  Cervical: No cervical adenopathy.  Skin:    General: Skin is warm and dry.  Neurological:     General: No focal deficit present.     Mental Status: She is alert and oriented to person, place, and time. Mental status is at baseline.  Psychiatric:        Mood and Affect: Mood normal.        Behavior: Behavior normal.        Thought Content: Thought content normal.        Judgment: Judgment normal.     No results found for any visits on 10/25/22.      Assessment & Plan:   Problem List Items Addressed This Visit     Viral URI with cough - Primary    Reassured patient that symptoms and exam findings are most consistent with a viral upper respiratory infection and explained lack of efficacy of antibiotics against viruses.  Discussed expected course and  features suggestive of secondary bacterial infection.  Continue supportive care. Increase fluid intake with water or electrolyte solution like pedialyte. Encouraged acetaminophen as needed for fever/pain. Encouraged salt water gargling, chloraseptic spray and throat lozenges. Encouraged OTC guaifenesin. Encouraged saline sinus flushes and/or neti with humidified air.      Relevant Orders   SARS-CoV-2 RNA, Influenza A/B, and RSV RNA, Qualitative NAAT    Meds ordered this encounter  Medications   albuterol (VENTOLIN HFA) 108 (90 Base) MCG/ACT inhaler    Sig: Inhale 2 puffs into the lungs every 6 (six) hours as needed for wheezing or shortness of breath.    Dispense:  8 g    Refill:  0    Order Specific Question:   Supervising Provider    Answer:   Lynnea Ferrier T [[id number]]   HYDROcodone bit-homatropine (HYCODAN) 5-1.5 MG/5ML syrup    Sig: Take 5 mLs by mouth every 8 (eight) hours as needed for cough.    Dispense:  120 mL    Refill:  0    Order Specific Question:   Supervising Provider    Answer:   Lynnea Ferrier T [[id number]]    Return if symptoms worsen or fail to improve.  Park Meo, FNP

## 2022-10-26 LAB — SARS-COV-2 RNA, INFLUENZA A/B, AND RSV RNA, QUALITATIVE NAAT
INFLUENZA A RNA: NOT DETECTED
INFLUENZA B RNA: NOT DETECTED
RSV RNA: NOT DETECTED
SARS COV2 RNA: NOT DETECTED

## 2022-11-08 ENCOUNTER — Other Ambulatory Visit: Payer: Self-pay | Admitting: Family Medicine

## 2022-11-08 ENCOUNTER — Telehealth: Payer: Self-pay

## 2022-11-08 DIAGNOSIS — J029 Acute pharyngitis, unspecified: Secondary | ICD-10-CM

## 2022-11-08 MED ORDER — ALPRAZOLAM 0.5 MG PO TABS: 0.5000 mg | ORAL_TABLET | Freq: Three times a day (TID) | ORAL | 0 refills | Status: DC | PRN

## 2022-11-08 NOTE — Telephone Encounter (Signed)
Pt called in to request a refill of:  Prescription Request  11/08/2022  LOV: 09/05/22  What is the name of the medication or equipment? ALPRAZolam (XANAX) 0.5 MG tablet [[id number]]  Have you contacted your pharmacy to request a refill? No   Which pharmacy would you like this sent to?  [id number] [place], [place] - [address] [place] [address] Phone: [phone]  Fax: [phone]     Patient notified that their request is being sent to the clinical staff for review and that they should receive a response within 2 business days.   Please advise at [facility] [phone]

## 2023-01-06 ENCOUNTER — Telehealth: Payer: Self-pay | Admitting: Family Medicine

## 2023-01-06 ENCOUNTER — Other Ambulatory Visit: Payer: Self-pay | Admitting: Family Medicine

## 2023-01-06 DIAGNOSIS — J029 Acute pharyngitis, unspecified: Secondary | ICD-10-CM

## 2023-01-06 MED ORDER — ALPRAZOLAM 0.5 MG PO TABS: 0.5000 mg | ORAL_TABLET | Freq: Three times a day (TID) | ORAL | 0 refills | Status: DC | PRN

## 2023-01-06 NOTE — Telephone Encounter (Signed)
Prescription Request  01/06/2023  LOV: 09/05/2022  What is the name of the medication or equipment?   ALPRAZolam (XANAX) 0.5 MG tablet  **only has 2 pills left**  Have you contacted your pharmacy to request a refill? Yes   Which pharmacy would you like this sent to?   [id number] [place], [place] - [address] AT [address] [place] [address] Phone: [phone]  Fax: [phone] DEA #: [id number]    Patient notified that their request is being sent to the clinical staff for review and that they should receive a response within 2 business days.   Please advise patient at [phone].

## 2023-01-25 ENCOUNTER — Emergency Department (HOSPITAL_BASED_OUTPATIENT_CLINIC_OR_DEPARTMENT_OTHER): Payer: Medicaid Other

## 2023-01-25 ENCOUNTER — Other Ambulatory Visit: Payer: Self-pay

## 2023-01-25 ENCOUNTER — Emergency Department (HOSPITAL_BASED_OUTPATIENT_CLINIC_OR_DEPARTMENT_OTHER)
Admission: EM | Admit: 2023-01-25 | Discharge: 2023-01-26 | Disposition: A | Discharge status: Home / Self Care | Payer: Medicaid Other | Attending: Emergency Medicine | Admitting: Emergency Medicine

## 2023-01-25 ENCOUNTER — Encounter (HOSPITAL_BASED_OUTPATIENT_CLINIC_OR_DEPARTMENT_OTHER): Payer: Self-pay | Admitting: Emergency Medicine

## 2023-01-25 DIAGNOSIS — D500 Iron deficiency anemia secondary to blood loss (chronic): Secondary | ICD-10-CM | POA: Diagnosis not present

## 2023-01-25 DIAGNOSIS — N938 Other specified abnormal uterine and vaginal bleeding: Secondary | ICD-10-CM | POA: Diagnosis not present

## 2023-01-25 DIAGNOSIS — N939 Abnormal uterine and vaginal bleeding, unspecified: Secondary | ICD-10-CM | POA: Diagnosis present

## 2023-01-25 LAB — BASIC METABOLIC PANEL
Anion gap: 9 (ref 5–15)
BUN: 12 mg/dL (ref 6–20)
CO2: 24 mmol/L (ref 22–32)
Calcium: 9.1 mg/dL (ref 8.9–10.3)
Chloride: 106 mmol/L (ref 98–111)
Creatinine, Ser: 0.74 mg/dL (ref 0.44–1.00)
GFR, Estimated: >60 mL/min (ref >60)
Glucose, Bld: 88 mg/dL (ref 70–99)
Potassium: 3.6 mmol/L (ref 3.5–5.1)
Sodium: 139 mmol/L (ref 135–145)

## 2023-01-25 LAB — URINALYSIS, ROUTINE W REFLEX MICROSCOPIC
Bacteria, UA: NONE SEEN
Bilirubin Urine: NEGATIVE
Glucose, UA: NEGATIVE mg/dL
Ketones, ur: NEGATIVE mg/dL
Leukocytes,Ua: NEGATIVE
Nitrite: NEGATIVE
Protein, ur: NEGATIVE mg/dL
Specific Gravity, Urine: 1.01 (ref 1.005–1.030)
pH: 6.5 (ref 5.0–8.0)

## 2023-01-25 LAB — CBC
HCT: 32.9 % — ABNORMAL LOW (ref 36.0–46.0)
Hemoglobin: 10.4 g/dL — ABNORMAL LOW (ref 12.0–15.0)
MCH: 24.8 pg — ABNORMAL LOW (ref 26.0–34.0)
MCHC: 31.6 g/dL (ref 30.0–36.0)
MCV: 78.5 fL — ABNORMAL LOW (ref 80.0–100.0)
Platelets: 243 10*3/uL (ref 150–400)
RBC: 4.19 MIL/uL (ref 3.87–5.11)
RDW: 15.8 % — ABNORMAL HIGH (ref 11.5–15.5)
WBC: 6.9 10*3/uL (ref 4.0–10.5)
nRBC: 0 % (ref 0.0–0.2)

## 2023-01-25 LAB — WET PREP, GENITAL
Clue Cells Wet Prep HPF POC: NONE SEEN
Sperm: NONE SEEN
Trich, Wet Prep: NONE SEEN
WBC, Wet Prep HPF POC: <10 (ref <10)
Yeast Wet Prep HPF POC: NONE SEEN

## 2023-01-25 LAB — HCG, QUANTITATIVE, PREGNANCY: hCG, Beta Chain, Quant, S: <1 m[IU]/mL (ref <5)

## 2023-01-25 MED ORDER — LACTATED RINGERS IV BOLUS
500.0000 mL | Freq: Once | INTRAVENOUS | Status: AC
  Administered 2023-01-25: 500 mL via INTRAVENOUS

## 2023-01-25 MED ORDER — LACTATED RINGERS IV BOLUS
1000.0000 mL | Freq: Once | INTRAVENOUS | Status: AC
  Administered 2023-01-25: 1000 mL via INTRAVENOUS

## 2023-01-25 MED ORDER — LACTATED RINGERS IV SOLN: INTRAVENOUS | Status: DC

## 2023-01-25 MED ORDER — MEGESTROL ACETATE 40 MG PO TABS
120.0000 mg | ORAL_TABLET | Freq: Once | ORAL | Status: AC
  Administered 2023-01-26: 120 mg via ORAL
  Filled 2023-01-25: qty 3

## 2023-01-25 NOTE — ED Provider Notes (Deleted)
  [facility] EMERGENCY DEPARTMENT AT [facility] Provider Note   CSN: [id number] Arrival date & time: 01/25/23  2056     History  Chief Complaint  Patient presents with   Weakness   Vaginal Bleeding    Taylor Rios is a 27 y.o. female.  HPI disregard this open note.  There is a separate completely dictated note.    Home Medications Prior to Admission medications   Medication Sig Start Date End Date Taking? Authorizing Provider  acetaminophen (TYLENOL) 325 MG tablet Take 2 tablets (650 mg total) by mouth every 6 (six) hours as needed. Patient not taking: Reported on 10/25/2022 09/06/21   Tanda Rockers A, DO  albuterol (VENTOLIN HFA) 108 (90 Base) MCG/ACT inhaler Inhale 2 puffs into the lungs every 6 (six) hours as needed for wheezing or shortness of breath. 10/25/22   Park Meo, FNP  ALPRAZolam Prudy Feeler) 0.5 MG tablet Take 1 tablet (0.5 mg total) by mouth 3 (three) times daily as needed. 01/06/23   Donita Brooks, MD  HYDROcodone bit-homatropine (HYCODAN) 5-1.5 MG/5ML syrup Take 5 mLs by mouth every 8 (eight) hours as needed for cough. 10/25/22   Park Meo, FNP  hydrOXYzine (VISTARIL) 25 MG capsule Take 1 capsule (25 mg total) by mouth every 8 (eight) hours as needed for anxiety. 08/16/22   Donita Brooks, MD  ibuprofen (ADVIL) 800 MG tablet Take 1 tablet (800 mg total) by mouth 3 (three) times daily. Take with food Patient not taking: Reported on 10/25/2022 09/06/21   Sloan Leiter, DO  predniSONE (DELTASONE) 20 MG tablet 3 tabs poqday 1-2, 2 tabs poqday 3-4, 1 tab poqday 5-6 Patient not taking: Reported on 10/25/2022 09/05/22   Donita Brooks, MD      Allergies    Claritin [loratadine]    Review of Systems   Review of Systems  Physical Exam Updated Vital Signs BP 91/73   Pulse 96   Temp (!) 97.1 F (36.2 C)   Resp 18   Wt 100.2 kg   LMP 01/23/2023 (Approximate)   SpO2 100%   BMI 34.60 kg/m  Physical Exam  ED Results / Procedures /  Treatments   Labs (all labs ordered are listed, but only abnormal results are displayed) Labs Reviewed  CBC - Abnormal; Notable for the following components:      Result Value   Hemoglobin 10.4 (*)    HCT 32.9 (*)    MCV 78.5 (*)    MCH 24.8 (*)    RDW 15.8 (*)    All other components within normal limits  PREGNANCY, URINE  BASIC METABOLIC PANEL    EKG None  Radiology No results found.  Procedures Procedures    Medications Ordered in ED Medications - No data to display  ED Course/ Medical Decision Making/ A&P                                 Medical Decision Making Amount and/or Complexity of Data Reviewed Labs: ordered. Radiology: ordered.  Risk OTC drugs. Prescription drug management.           Final Clinical Impression(s) / ED Diagnoses Final diagnoses:  None    Rx / DC Orders ED Discharge Orders     None         Arby Barrette, MD 01/29/23 [phone]

## 2023-01-25 NOTE — ED Notes (Signed)
Patient resting quietly in stretcher, respirations even, unlabored, no acute distress noted. Denies needs at this time.  

## 2023-01-25 NOTE — ED Notes (Signed)
Patient ambulatory to restroom  ?

## 2023-01-25 NOTE — ED Provider Notes (Signed)
[place] EMERGENCY DEPARTMENT AT [facility] Provider Note   CSN: [id number] Arrival date & time: 01/25/23  2056     History {Add pertinent medical, surgical, social history, OB history to HPI:1} Chief Complaint  Patient presents with   Weakness   Vaginal Bleeding    Taylor Rios is a 27 y.o. female.  HPI     Home Medications Prior to Admission medications   Medication Sig Start Date End Date Taking? Authorizing Provider  acetaminophen (TYLENOL) 325 MG tablet Take 2 tablets (650 mg total) by mouth every 6 (six) hours as needed. Patient not taking: Reported on 10/25/2022 09/06/21   Tanda Rockers A, DO  albuterol (VENTOLIN HFA) 108 (90 Base) MCG/ACT inhaler Inhale 2 puffs into the lungs every 6 (six) hours as needed for wheezing or shortness of breath. 10/25/22   Park Meo, FNP  ALPRAZolam Prudy Feeler) 0.5 MG tablet Take 1 tablet (0.5 mg total) by mouth 3 (three) times daily as needed. 01/06/23   Donita Brooks, MD  HYDROcodone bit-homatropine (HYCODAN) 5-1.5 MG/5ML syrup Take 5 mLs by mouth every 8 (eight) hours as needed for cough. 10/25/22   Park Meo, FNP  hydrOXYzine (VISTARIL) 25 MG capsule Take 1 capsule (25 mg total) by mouth every 8 (eight) hours as needed for anxiety. 08/16/22   Donita Brooks, MD  ibuprofen (ADVIL) 800 MG tablet Take 1 tablet (800 mg total) by mouth 3 (three) times daily. Take with food Patient not taking: Reported on 10/25/2022 09/06/21   Sloan Leiter, DO  predniSONE (DELTASONE) 20 MG tablet 3 tabs poqday 1-2, 2 tabs poqday 3-4, 1 tab poqday 5-6 Patient not taking: Reported on 10/25/2022 09/05/22   Donita Brooks, MD      Allergies    Claritin [loratadine]    Review of Systems   Review of Systems  Physical Exam Updated Vital Signs BP 91/73   Pulse 96   Temp (!) 97.1 F (36.2 C)   Resp 18   Wt 100.2 kg   LMP 01/23/2023 (Approximate)   SpO2 100%   BMI 34.60 kg/m  Physical Exam  ED Results / Procedures / Treatments    Labs (all labs ordered are listed, but only abnormal results are displayed) Labs Reviewed  CBC - Abnormal; Notable for the following components:      Result Value   Hemoglobin 10.4 (*)    HCT 32.9 (*)    MCV 78.5 (*)    MCH 24.8 (*)    RDW 15.8 (*)    All other components within normal limits  URINALYSIS, ROUTINE W REFLEX MICROSCOPIC - Abnormal; Notable for the following components:   Color, Urine COLORLESS (*)    Hgb urine dipstick MODERATE (*)    All other components within normal limits  BASIC METABOLIC PANEL  HCG, QUANTITATIVE, PREGNANCY    EKG None  Radiology No results found.  Procedures Procedures  {Document cardiac monitor, telemetry assessment procedure when appropriate:1}  Medications Ordered in ED Medications  lactated ringers infusion (has no administration in time range)  lactated ringers bolus 500 mL (0 mLs Intravenous Stopped 01/25/23 2211)    ED Course/ Medical Decision Making/ A&P   {   Click here for ABCD2, HEART and other calculatorsREFRESH Note before signing :1}                              Medical Decision Making Amount and/or Complexity of Data  Reviewed Labs: ordered.  Risk Prescription drug management.   ***  {Document critical care time when appropriate:1} {Document review of labs and clinical decision tools ie heart score, Chads2Vasc2 etc:1}  {Document your independent review of radiology images, and any outside records:1} {Document your discussion with family members, caretakers, and with consultants:1} {Document social determinants of health affecting pt's care:1} {Document your decision making why or why not admission, treatments were needed:1} Final Clinical Impression(s) / ED Diagnoses Final diagnoses:  None    Rx / DC Orders ED Discharge Orders     None

## 2023-01-25 NOTE — ED Triage Notes (Signed)
Pt in with vaginal bleeding intermittently since 7/15. Pt states over the past few days she has been weak and near syncopal, is now passing baseball sized clots and changing tampons q46min. Pale on assessment, "feels like her heart is pounding"

## 2023-01-25 NOTE — ED Notes (Signed)
Pt to ultrasound by wheelchair at this time.

## 2023-01-26 LAB — HEMOGLOBIN AND HEMATOCRIT, BLOOD
HCT: 30.3 % — ABNORMAL LOW (ref 36.0–46.0)
Hemoglobin: 9.4 g/dL — ABNORMAL LOW (ref 12.0–15.0)

## 2023-01-26 MED ORDER — IRON 90 (18 FE) MG PO TABS: 1.0000 | ORAL_TABLET | Freq: Two times a day (BID) | ORAL | 0 refills | Status: DC

## 2023-01-26 MED ORDER — MEGESTROL ACETATE 40 MG PO TABS: ORAL_TABLET | ORAL | 0 refills | Status: DC

## 2023-01-26 NOTE — Discharge Instructions (Addendum)
1.  Is very important that you are seen as soon as possible for management of heavy vaginal bleeding and anemia.  Contact information is included for [facility].  This is your low cost option for getting seen as soon as possible. 2.  You were given 1 dose of Megace in the emergency department.  Fill the prescription tomorrow and take daily as prescribed.  This is a tapering dose.  You need to have a follow-up appointment scheduled before you complete this prescription because you will need ongoing treatment.  Once you finish just you may/will likely have heavy bleeding again. 3.  Start iron supplements tomorrow.  You need this to treat anemia. 4.  Return to emergency department immediately if you have ongoing heavy bleeding and are feeling lightheaded and dizzy or significantly weak or short of breath.

## 2023-01-26 NOTE — ED Notes (Signed)
Reviewed AVS with patient, patient expressed understanding of directions, denies further questions at this time. 

## 2023-01-27 LAB — GC/CHLAMYDIA PROBE AMP (~~LOC~~) NOT AT ARMC
Chlamydia: NEGATIVE
Comment: NEGATIVE
Comment: NORMAL
Neisseria Gonorrhea: NEGATIVE

## 2023-03-09 ENCOUNTER — Other Ambulatory Visit: Payer: Self-pay | Admitting: Family Medicine

## 2023-03-09 ENCOUNTER — Telehealth: Payer: Self-pay | Admitting: Family Medicine

## 2023-03-09 DIAGNOSIS — J029 Acute pharyngitis, unspecified: Secondary | ICD-10-CM

## 2023-03-09 MED ORDER — ALPRAZOLAM 0.5 MG PO TABS
0.5000 mg | ORAL_TABLET | Freq: Three times a day (TID) | ORAL | 0 refills | Status: AC | PRN
Start: 2023-03-09 — End: ?

## 2023-03-09 NOTE — Telephone Encounter (Signed)
Prescription Request  03/09/2023  LOV: 09/05/2022  What is the name of the medication or equipment?   ALPRAZolam (XANAX) 0.5 MG tablet  **Patient has 2 pills left** **Med refill appt scheduled for Mon, 03/13/23**  Have you contacted your pharmacy to request a refill? Yes   Which pharmacy would you like this sent to?  [id number] - [place], [place] - [address] AT [facility] [address] [place] [address] Phone: [phone] Fax: [phone]    Patient notified that their request is being sent to the clinical staff for review and that they should receive a response within 2 business days.   Please advise patient at [phone], or [phone].

## 2023-03-13 ENCOUNTER — Ambulatory Visit (INDEPENDENT_AMBULATORY_CARE_PROVIDER_SITE_OTHER): Payer: Medicaid Other | Admitting: Family Medicine

## 2023-03-13 ENCOUNTER — Encounter: Payer: Self-pay | Admitting: Family Medicine

## 2023-03-13 VITALS — BP 120/72 | HR 76 | Temp 98.6°F | Ht 67.0 in | Wt 219.0 lb

## 2023-03-13 DIAGNOSIS — F410 Panic disorder [episodic paroxysmal anxiety] without agoraphobia: Secondary | ICD-10-CM | POA: Diagnosis not present

## 2023-03-13 DIAGNOSIS — N938 Other specified abnormal uterine and vaginal bleeding: Secondary | ICD-10-CM | POA: Diagnosis not present

## 2023-03-13 DIAGNOSIS — J029 Acute pharyngitis, unspecified: Secondary | ICD-10-CM

## 2023-03-13 MED ORDER — ALPRAZOLAM 0.5 MG PO TABS
0.5000 mg | ORAL_TABLET | Freq: Three times a day (TID) | ORAL | 0 refills | Status: DC | PRN
Start: 2023-03-13 — End: 2023-09-15

## 2023-03-13 MED ORDER — IRON (FERROUS SULFATE) 325 (65 FE) MG PO TABS: 325.0000 mg | ORAL_TABLET | Freq: Every day | ORAL | 4 refills | Status: DC

## 2023-03-13 MED ORDER — NORETHINDRONE ACET-ETHINYL EST 1-20 MG-MCG PO TABS: 1.0000 | ORAL_TABLET | Freq: Every day | ORAL | 11 refills | Status: AC
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
## 2023-03-14 LAB — CBC WITH DIFFERENTIAL/PLATELET
Absolute Monocytes: 732 {cells}/uL (ref 200–950)
Basophils Absolute: 54 {cells}/uL (ref 0–200)
Basophils Relative: 0.7 %
Eosinophils Absolute: 323 {cells}/uL (ref 15–500)
Eosinophils Relative: 4.2 %
HCT: 36.4 % (ref 35.0–45.0)
Hemoglobin: 10.9 g/dL — ABNORMAL LOW (ref 11.7–15.5)
Lymphs Abs: 2071 {cells}/uL (ref 850–3900)
MCH: 23.9 pg — ABNORMAL LOW (ref 27.0–33.0)
MCHC: 29.9 g/dL — ABNORMAL LOW (ref 32.0–36.0)
MCV: 79.8 fL — ABNORMAL LOW (ref 80.0–100.0)
MPV: 13.2 fL — ABNORMAL HIGH (ref 7.5–12.5)
Monocytes Relative: 9.5 %
Neutro Abs: 4520 {cells}/uL (ref 1500–7800)
Neutrophils Relative %: 58.7 %
Platelets: 239 10*3/uL (ref 140–400)
RBC: 4.56 10*6/uL (ref 3.80–5.10)
RDW: 14.4 % (ref 11.0–15.0)
Total Lymphocyte: 26.9 %
WBC: 7.7 10*3/uL (ref 3.8–10.8)

## 2023-03-14 LAB — COMPLETE METABOLIC PANEL WITH GFR
AG Ratio: 1.6 (calc) (ref 1.0–2.5)
ALT: 10 U/L (ref 6–29)
AST: 12 U/L (ref 10–30)
Albumin: 4.6 g/dL (ref 3.6–5.1)
Alkaline phosphatase (APISO): 51 U/L (ref 31–125)
BUN: 18 mg/dL (ref 7–25)
CO2: 26 mmol/L (ref 20–32)
Calcium: 9.3 mg/dL (ref 8.6–10.2)
Chloride: 106 mmol/L (ref 98–110)
Creat: 0.78 mg/dL (ref 0.50–0.96)
Globulin: 2.8 g/dL (ref 1.9–3.7)
Glucose, Bld: 80 mg/dL (ref 65–99)
Potassium: 4.8 mmol/L (ref 3.5–5.3)
Sodium: 138 mmol/L (ref 135–146)
Total Bilirubin: 0.3 mg/dL (ref 0.2–1.2)
Total Protein: 7.4 g/dL (ref 6.1–8.1)
eGFR: 107 mL/min/{1.73_m2} (ref >=60)

## 2023-03-17 ENCOUNTER — Telehealth: Payer: Self-pay

## 2023-03-17 ENCOUNTER — Other Ambulatory Visit: Payer: Self-pay

## 2023-03-17 DIAGNOSIS — N938 Other specified abnormal uterine and vaginal bleeding: Secondary | ICD-10-CM

## 2023-03-17 MED ORDER — IRON (FERROUS SULFATE) 325 (65 FE) MG PO TABS
325.0000 mg | ORAL_TABLET | Freq: Every day | ORAL | 4 refills | Status: AC
Start: 2023-03-17 — End: ?

## 2023-03-17 NOTE — Telephone Encounter (Signed)
Pt called to ask if nurse/pcp would resend this med Iron, Ferrous Sulfate, 325 (65 Fe) MG TABS [[id number]] to the [facility] [address] in [place]. Pt stated that the current pharmacy is completely out of stock. Please advise.  Cb#: [phone]

## 2023-04-07 ENCOUNTER — Other Ambulatory Visit (HOSPITAL_COMMUNITY)
Admission: RE | Admit: 2023-04-07 | Discharge: 2023-04-07 | Disposition: A | Discharge status: Home / Self Care | Payer: Medicaid Other | Source: Ambulatory Visit | Attending: Obstetrics and Gynecology | Admitting: Obstetrics and Gynecology

## 2023-04-07 ENCOUNTER — Ambulatory Visit: Payer: Medicaid Other | Admitting: Obstetrics and Gynecology

## 2023-04-07 ENCOUNTER — Encounter: Payer: Self-pay | Admitting: Obstetrics and Gynecology

## 2023-04-07 VITALS — BP 110/54 | HR 79 | Ht 67.0 in | Wt 224.0 lb

## 2023-04-07 DIAGNOSIS — Z01419 Encounter for gynecological examination (general) (routine) without abnormal findings: Secondary | ICD-10-CM | POA: Diagnosis not present

## 2023-04-07 DIAGNOSIS — Z113 Encounter for screening for infections with a predominantly sexual mode of transmission: Secondary | ICD-10-CM

## 2023-04-07 NOTE — Progress Notes (Signed)
ANNUAL EXAM Patient name: Taylor Rios MRN [id number]  Date of birth: [birth year] Chief Complaint:   No chief complaint on file.  History of Present Illness:   Taylor Rios is a 27 y.o. G2P2002 being seen today for a routine annual exam.  Current complaints: annual  Menstrual concerns? Yes  reports an episode of prolonged bleeding in August/September. Had skipped a month of her menses and notes being very stressed at that time. After the missed menses had prolonged bleeding and presented to the ED where she was given megace and OCP. The prolonged bleeding stopped and subsequently had a normal menses. Has not started the OCP and wanted to see what her menses were like first Breast or nipple changes? No  Contraception use? No feels  Sexually active? Yes female/female partner  Patient's last menstrual period was 03/26/2023 (exact date).   The pregnancy intention screening data noted above was reviewed. Potential methods of contraception were discussed. The patient elected to proceed with No data recorded.   Last pap No results found for: "DIAGPAP", "HPVHIGH", "ADEQPAP"   H/O abnormal pap: no Reports pap done last year at [facility] that was normal Last mammogram: n/a.  Last colonoscopy: n/a.      10/26/2022    4:16 PM 10/26/2022   12:35 PM 08/16/2022    9:54 AM 05/22/2014   12:29 PM  Depression screen PHQ 2/9  Decreased Interest 0 0 0 0  Down, Depressed, Hopeless 0 0 0 0  PHQ - 2 Score 0 0 0 0  Altered sleeping 0 0    Tired, decreased energy 0 0    Change in appetite 0 0    Feeling bad or failure about yourself  0 0    Trouble concentrating 0 0    Moving slowly or fidgety/restless 0 0    Suicidal thoughts 0 0    PHQ-9 Score 0 0    Difficult doing work/chores Not difficult at all Not difficult at all          10/26/2022    4:17 PM  GAD 7 : Generalized Anxiety Score  Nervous, Anxious, on Edge 1  Control/stop worrying 1  Worry too much - different things 1   Trouble relaxing 1  Restless 0  Easily annoyed or irritable 0  Afraid - awful might happen 1  Total GAD 7 Score 5  Anxiety Difficulty Not difficult at all     Review of Systems:   Pertinent items are noted in HPI Denies any headaches, blurred vision, fatigue, shortness of breath, chest pain, abdominal pain, abnormal vaginal discharge/itching/odor/irritation, problems with periods, bowel movements, urination, or intercourse unless otherwise stated above. Pertinent History Reviewed:  Reviewed past medical,surgical, social and family history.  Reviewed problem list, medications and allergies. Physical Assessment:   Vitals:   04/07/23 0850  BP: (!) 110/54  Pulse: 79  Weight: 224 lb (101.6 kg)  Height: 5\' 7"  (1.702 m)  Body mass index is 35.08 kg/m.        Physical Examination:   General appearance - well appearing, and in no distress  Mental status - alert, oriented to person, place, and time  Psych:  She has a normal mood and affect  Skin - warm and dry, normal color, no suspicious lesions noted  Chest - effort normal, all lung fields clear to auscultation bilaterally  Heart - normal rate and regular rhythm  Breasts - breasts appear normal, no suspicious masses, no skin or nipple changes or  axillary nodes  Abdomen - soft, nontender, nondistended, no masses or organomegaly  Pelvic -  VULVA: normal appearing vulva with no masses, tenderness or lesions   VAGINA: normal appearing vagina with normal color and discharge, no lesions   CERVIX: not fully visualized due to patient discomfort, no CMT or masses palpated  UTERUS: limited evaluation  ADNEXA: No adnexal masses or tenderness noted.  Extremities:  No swelling or varicosities noted  Chaperone present for exam  No results found for this or any previous visit (from the past 24 hour(s)).    Assessment & Plan:  1. Well woman exam with routine gynecological exam - Cervical cancer screening: Discussed screening Q3 years.  Reviewed importance of annual exams and limits of pap smear. Pap with reflex HPV : recommend completing ROI for prior pap records - GC/CT: Discussed and recommended. Pt  accepts - Gardasil: completed - Birth Control:  none  - will resume OCP if menses are heavy. Noted that missed menses likely due to increased stress. If menses remain abnormal, will complete work up.  - Breast Health: Encouraged self breast awareness/exams.  - Follow-up: 12 months and prn   2. Screening examination for STI Screening completed   Orders Placed This Encounter  Procedures   RPR   HIV antibody (with reflex)   Hepatitis B Surface AntiGEN   Hepatitis C Antibody    Meds: No orders of the defined types were placed in this encounter.   Follow-up: Return if symptoms worsen or fail to improve, for Annual GYN.  Lorriane Shire, MD 04/07/2023 9:01 AM

## 2023-04-08 LAB — RPR: RPR Ser Ql: NONREACTIVE

## 2023-04-08 LAB — HEPATITIS B SURFACE ANTIGEN: Hepatitis B Surface Ag: NEGATIVE

## 2023-04-08 LAB — HEPATITIS C ANTIBODY: Hep C Virus Ab: NONREACTIVE

## 2023-04-08 LAB — HIV ANTIBODY (ROUTINE TESTING W REFLEX): HIV Screen 4th Generation wRfx: NONREACTIVE

## 2023-04-11 ENCOUNTER — Other Ambulatory Visit: Payer: Self-pay | Admitting: Obstetrics and Gynecology

## 2023-04-11 DIAGNOSIS — B9689 Other specified bacterial agents as the cause of diseases classified elsewhere: Secondary | ICD-10-CM

## 2023-04-11 LAB — CERVICOVAGINAL ANCILLARY ONLY
Bacterial Vaginitis (gardnerella): POSITIVE — AB
Candida Glabrata: NEGATIVE
Candida Vaginitis: NEGATIVE
Chlamydia: NEGATIVE
Comment: NEGATIVE
Comment: NEGATIVE
Comment: NEGATIVE
Comment: NEGATIVE
Comment: NEGATIVE
Comment: NORMAL
Neisseria Gonorrhea: NEGATIVE
Trichomonas: NEGATIVE

## 2023-04-11 MED ORDER — METRONIDAZOLE 500 MG PO TABS
500.0000 mg | ORAL_TABLET | Freq: Two times a day (BID) | ORAL | 0 refills | Status: AC
Start: 2023-04-11 — End: 2023-04-18

## 2023-08-30 ENCOUNTER — Ambulatory Visit (INDEPENDENT_AMBULATORY_CARE_PROVIDER_SITE_OTHER): Admitting: Family Medicine

## 2023-08-30 ENCOUNTER — Encounter: Payer: Self-pay | Admitting: Family Medicine

## 2023-08-30 ENCOUNTER — Ambulatory Visit
Admission: RE | Admit: 2023-08-30 | Discharge: 2023-08-30 | Disposition: A | Discharge status: Home / Self Care | Source: Ambulatory Visit | Attending: Family Medicine

## 2023-08-30 VITALS — BP 120/70 | HR 81 | Ht 67.0 in | Wt 221.6 lb

## 2023-08-30 DIAGNOSIS — U071 COVID-19: Secondary | ICD-10-CM | POA: Diagnosis not present

## 2023-08-30 MED ORDER — ALBUTEROL SULFATE HFA 108 (90 BASE) MCG/ACT IN AERS: 2.0000 | INHALATION_SPRAY | Freq: Four times a day (QID) | RESPIRATORY_TRACT | 0 refills | Status: DC | PRN

## 2023-08-30 NOTE — Progress Notes (Signed)
 Subjective:  HPI: Taylor Rios is a 28 y.o. female presenting on 08/30/2023 for chest congestion (Pt states she was dx with Covid on Saturday and has congested, productive cough. Mucous has been green to bloody per pt. )   HPI Patient is in today for recent diagnosis of Covid 5 days ago and concerns about a lingering cough that is sometimes blood tingled. She denies fever, chills, body aches, SOB, pleurisy, wheezing. She does endorse some fatigue. She was not treated for covid and does not have an albuterol inhaler.   Review of Systems  All other systems reviewed and are negative.   Relevant past medical history reviewed and updated as indicated.   Past Medical History:  Diagnosis Date   ADHD (attention deficit hyperactivity disorder)    Anemia    Anxiety    Hx of chlamydia infection    Hx of gonorrhea    Subclinical hypothyroidism      Past Surgical History:  Procedure Laterality Date   CESAREAN SECTION N/A 03/09/2016   Procedure: CESAREAN SECTION;  Surgeon: Olivia Mackie, MD;  Location: [facility] BIRTHING SU[facility];  Service: Obstetrics;  Laterality: N/A;   CESAREAN SECTION N/A 04/02/2021   Procedure: Repeat CESAREAN SECTION;  Surgeon: Olivia Mackie, MD;  Location: [facility] LD ORS;  Service: Obstetrics;  Laterality: N/A;  EDD: 04/06/21    Allergies and medications reviewed and updated.   Current Outpatient Medications:    ALPRAZolam (XANAX) 0.5 MG tablet, Take 1 tablet (0.5 mg total) by mouth 3 (three) times daily as needed., Disp: 30 tablet, Rfl: 0   albuterol (VENTOLIN HFA) 108 (90 Base) MCG/ACT inhaler, Inhale 2 puffs into the lungs every 6 (six) hours as needed for wheezing or shortness of breath., Disp: 8 g, Rfl: 0   Iron, Ferrous Sulfate, 325 (65 Fe) MG TABS, Take 325 mg by mouth daily. (Patient not taking: Reported on 08/30/2023), Disp: 30 tablet, Rfl: 4   norethindrone-ethinyl estradiol (LOESTRIN 1/20, 21,) 1-20 MG-MCG tablet, Take 1 tablet by mouth daily. (Patient not  taking: Reported on 08/30/2023), Disp: 28 tablet, Rfl: 11  Allergies  Allergen Reactions   Claritin [Loratadine] Shortness Of Breath, Nausea Only and Swelling    Objective:   BP 120/70   Pulse 81   Ht 5\' 7"  (1.702 m)   Wt 221 lb 9.6 oz (100.5 kg)   SpO2 99%   BMI 34.71 kg/m      08/30/2023   12:02 PM 04/07/2023    8:50 AM 03/13/2023    2:04 PM  Vitals with BMI  Height 5\' 7"  5\' 7"  5\' 7"   Weight 221 lbs 10 oz 224 lbs 219 lbs  BMI 34.7 35.08 34.29  Systolic 120 110 098  Diastolic 70 54 72  Pulse 81 79 76     Physical Exam Vitals and nursing note reviewed.  Constitutional:      Appearance: Normal appearance. She is normal weight.  HENT:     Head: Normocephalic and atraumatic.  Cardiovascular:     Rate and Rhythm: Normal rate and regular rhythm.     Pulses: Normal pulses.     Heart sounds: Normal heart sounds.  Pulmonary:     Effort: Pulmonary effort is normal.     Breath sounds: Wheezing present.  Skin:    General: Skin is warm and dry.  Neurological:     General: No focal deficit present.     Mental Status: She is alert and oriented to person, place, and time. Mental status is  at baseline.  Psychiatric:        Mood and Affect: Mood normal.        Behavior: Behavior normal.        Thought Content: Thought content normal.        Judgment: Judgment normal.     Assessment & Plan:  COVID Assessment & Plan: Diffuse wheezing on exam today. Duo neb inhaler administered and wheezing improved however lung sounds remain coarse. Will obtain CXR. Start Albuterol 1-2 puffs every 4-6h PRN for SOB or wheezing. Return to office if symptoms persist or worsen.  Orders: -     DG Chest 2 View; Future  Other orders -     Albuterol Sulfate HFA; Inhale 2 puffs into the lungs every 6 (six) hours as needed for wheezing or shortness of breath.  Dispense: 8 g; Refill: 0     Follow up plan: Return if symptoms worsen or fail to improve.  Park Meo, FNP

## 2023-08-30 NOTE — Assessment & Plan Note (Signed)
 Diffuse wheezing on exam today. Duo neb inhaler administered and wheezing improved however lung sounds remain coarse. Will obtain CXR. Start Albuterol 1-2 puffs every 4-6h PRN for SOB or wheezing. Return to office if symptoms persist or worsen.

## 2023-09-05 ENCOUNTER — Telehealth: Payer: Self-pay

## 2023-09-05 NOTE — Telephone Encounter (Signed)
 Copied from CRM [id number]. Topic: Clinical - Lab/Test Results >> Sep 05, 2023 12:59 PM Elle L wrote: Reason for CRM: The patient is requesting a call back at [phone] regarding her imaging results. She advised that she is having shortness of breath and she is using an inhaler as needed but it is worrisome. She declined Nurse Triage.

## 2023-09-15 ENCOUNTER — Other Ambulatory Visit: Payer: Self-pay | Admitting: Family Medicine

## 2023-09-15 DIAGNOSIS — J029 Acute pharyngitis, unspecified: Secondary | ICD-10-CM

## 2023-09-15 NOTE — Telephone Encounter (Signed)
 Copied from CRM [id number]. Topic: Clinical - Medication Refill >> Sep 15, 2023  3:26 PM Eunice Blase wrote: Most Recent Primary Care Visit:  Provider: Park Meo  Department: [facility]  Visit Type: ACUTE  Date: 08/30/2023  Medication: ALPRAZolam (XANAX) 0.5 MG tablet  Has the patient contacted their pharmacy? Yes (Agent: If no, request that the patient contact the pharmacy for the refill. If patient does not wish to contact the pharmacy document the reason why and proceed with request.) (Agent: If yes, when and what did the pharmacy advise?)Pharmacy need PCP approval  Is this the correct pharmacy for this prescription? Yes If no, delete pharmacy and type the correct one.  This is the patient's preferred pharmacy:  [id number] - [place], [place] - [address] AT [facility] [address] [place] [address] Phone: [phone] Fax: [phone]   Has the prescription been filled recently? Yes  Is the patient out of the medication? Yes  Has the patient been seen for an appointment in the last year OR does the patient have an upcoming appointment? Yes  Can we respond through MyChart? Yes  Agent: Please be advised that Rx refills may take up to 3 business days. We ask that you follow-up with your pharmacy.

## 2023-09-18 MED ORDER — ALPRAZOLAM 0.5 MG PO TABS
0.5000 mg | ORAL_TABLET | Freq: Three times a day (TID) | ORAL | 0 refills | Status: DC | PRN
Start: 2023-09-18 — End: 2023-12-08

## 2023-09-18 NOTE — Telephone Encounter (Signed)
 Requested medications are due for refill today.  yes  Requested medications are on the active medications list.  yes  Last refill. 03/13/2023 #30 0 rf  Future visit scheduled.   no  Notes to clinic.  Refill not delegated.    Requested Prescriptions  Pending Prescriptions Disp Refills   ALPRAZolam (XANAX) 0.5 MG tablet 30 tablet 0    Sig: Take 1 tablet (0.5 mg total) by mouth 3 (three) times daily as needed.     Not Delegated - Psychiatry: Anxiolytics/Hypnotics 2 Failed - 09/18/2023 10:02 AM      Failed - This refill cannot be delegated      Failed - Urine Drug Screen completed in last 360 days      Failed - Valid encounter within last 6 months    Recent Outpatient Visits           2 weeks ago COVID   [facility] Park Meo, FNP   6 months ago DUB (dysfunctional uterine bleeding)   [facility] Tanya Nones, Priscille Heidelberg, MD   10 months ago Viral URI with cough   [facility] Park Meo, FNP   1 year ago RAD (reactive airway disease) with wheezing, mild intermittent, uncomplicated   [facility] Pickard, Priscille Heidelberg, MD   1 year ago Sore throat   [facility] Pickard, Priscille Heidelberg, MD              Passed - Patient is not pregnant

## 2023-09-19 ENCOUNTER — Ambulatory Visit
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
## 2023-12-08 ENCOUNTER — Other Ambulatory Visit: Payer: Self-pay | Admitting: Family Medicine

## 2023-12-08 ENCOUNTER — Other Ambulatory Visit: Payer: Self-pay

## 2023-12-08 ENCOUNTER — Ambulatory Visit: Payer: Self-pay

## 2023-12-08 ENCOUNTER — Ambulatory Visit
Admission: EM | Admit: 2023-12-08 | Discharge: 2023-12-08 | Disposition: A | Discharge status: Home / Self Care | Attending: Physician Assistant | Admitting: Physician Assistant

## 2023-12-08 DIAGNOSIS — J209 Acute bronchitis, unspecified: Secondary | ICD-10-CM

## 2023-12-08 DIAGNOSIS — J4521 Mild intermittent asthma with (acute) exacerbation: Secondary | ICD-10-CM | POA: Diagnosis not present

## 2023-12-08 DIAGNOSIS — J029 Acute pharyngitis, unspecified: Secondary | ICD-10-CM

## 2023-12-08 MED ORDER — PREDNISONE 20 MG PO TABS: 40.0000 mg | ORAL_TABLET | Freq: Every day | ORAL | 0 refills | Status: AC

## 2023-12-08 MED ORDER — IPRATROPIUM-ALBUTEROL 0.5-2.5 (3) MG/3ML IN SOLN
3.0000 mL | Freq: Once | RESPIRATORY_TRACT | Status: AC
  Administered 2023-12-08: 3 mL via RESPIRATORY_TRACT

## 2023-12-08 MED ORDER — AMOXICILLIN-POT CLAVULANATE 875-125 MG PO TABS: 1.0000 | ORAL_TABLET | Freq: Two times a day (BID) | ORAL | 0 refills | Status: AC

## 2023-12-08 MED ORDER — FLUCONAZOLE 150 MG PO TABS: 150.0000 mg | ORAL_TABLET | ORAL | 0 refills | Status: AC | PRN

## 2023-12-08 MED ORDER — PROMETHAZINE-DM 6.25-15 MG/5ML PO SYRP: 5.0000 mL | ORAL_SOLUTION | Freq: Four times a day (QID) | ORAL | 0 refills | Status: AC | PRN

## 2023-12-08 NOTE — Telephone Encounter (Signed)
 FYI Only or Action Required?: FYI only for provider.  Patient was last seen in primary care on 08/30/2023 by Kayla Jeoffrey RAMAN, FNP. Called Nurse Triage reporting Cough, Fatigue, Wheezing, chest pain when coughing, Shortness of Breath, and interrupted sleep. Symptoms began a week ago. Interventions attempted: Prescription medications: albuterol  inhaler and Rest, hydration, or home remedies. Symptoms are: rapidly worsening.  Triage Disposition: Go to ED Now (Notify PCP)  Patient/caregiver understands and will follow disposition?: Yes     Copied from CRM [id number]. Topic: Clinical - Red Word Triage >> Dec 08, 2023 10:54 AM Selinda RAMAN wrote: Red Word that prompted transfer to Nurse Triage: The patient called in stating she has felt bad for a few weeks on and off. She complains of wheezing, deep cough, and exhaustion from the cough and not sleeping well. I will transfer her to E2C2 NT Reason for Disposition  [1] MODERATE difficulty breathing (e.g., speaks in phrases, SOB even at rest, pulse 100-120) AND [2] NEW-onset or WORSE than normal  Answer Assessment - Initial Assessment Questions 1. RESPIRATORY STATUS: Describe your breathing? (e.g., wheezing, shortness of breath, unable to speak, severe coughing)      Wheezing, getting winded, exhale sounds tight, coughing fits and will hurt, cough up mucus, intense coughing, will wake me up out of sleep, slight pain only when coughing 2 nights ago was the worst, was taking deep breath felt like stabbing pain in lungs/shoulder, SOB even at rest, stabbing pain to left side but neck injury, no pain with deep breaths 2. ONSET: When did this breathing problem begin?      Felt bad for few weeks Got sick 6/19 woke up super sick, sore throat then headache then respiratory issues, fine but nasty cough lingering 3. PATTERN Does the difficult breathing come and go, or has it been constant since it started?      Not normal breathing, feel like when sleep at night not  breathing right 4. SEVERITY: How bad is your breathing? (e.g., mild, moderate, severe)    - MILD: No SOB at rest, mild SOB with walking, speaks normally in sentences, can lie down, no retractions, pulse < 100.    - MODERATE: SOB at rest, SOB with minimal exertion and prefers to sit, cannot lie down flat, speaks in phrases, mild retractions, audible wheezing, pulse 100-120.    - SEVERE: Very SOB at rest, speaks in single words, struggling to breathe, sitting hunched forward, retractions, pulse > 120      Not hard breathing but not too easy to breathe but not too hard, can hear wheezing over the phone 5. RECURRENT SYMPTOM: Have you had difficulty breathing before? If Yes, ask: When was the last time? and What happened that time?      Months ago covid, only asthmatic around dogs because allergic to dogs, allergens, when sick, no issues otherwise 6. CARDIAC HISTORY: Do you have any history of heart disease? (e.g., heart attack, angina, bypass surgery, angioplasty)      denies 7. LUNG HISTORY: Do you have any history of lung disease?  (e.g., pulmonary embolus, asthma, emphysema)     No asthma but have inhaler, with viruses tend to wheeze more than average, asthmatic when sick but don't have asthma, hx pneumonia 9. OTHER SYMPTOMS: Do you have any other symptoms? (e.g., dizziness, runny nose, cough, chest pain, fever)     So exhausted, no dizziness or weakness Little bit of nausea Baby crying in the back been irritating me 10. O2 SATURATION MONITOR:  Do you use an oxygen saturation monitor (pulse oximeter) at home? If Yes, ask: What is your reading (oxygen level) today? What is your usual oxygen saturation reading? (e.g., 95%)       No  Have rescue inhaler, been using it a lot, doesn't seem to do too much, still struggling a little bit, sick of this cough and the wheeze and making me sleepy, no nebulizer or maintenance inhaler  Advised pt go to ED for severity of symptoms and little  relief from inhaler. Pt verbalized understanding and intent to go to ED. Advised follow up with PCP after ED.  Protocols used: Breathing Difficulty-A-AH

## 2023-12-08 NOTE — Telephone Encounter (Unsigned)
 Copied from CRM [id number]. Topic: Clinical - Medication Refill >> Dec 08, 2023 11:27 AM Montie POUR wrote: Medication: ALPRAZolam  (XANAX ) 0.5 MG tablet  Has the patient contacted their pharmacy? Yes (Agent: If no, request that the patient contact the pharmacy for the refill. If patient does not wish to contact the pharmacy document the reason why and proceed with request.) (Agent: If yes, when and what did the pharmacy advise?) Pharmacy needs order to refill  This is the patient's preferred pharmacy:  [id number] - [place], [place] - [address] AT [facility] [address] [place] [address] Phone: [phone] Fax: [phone]  Is this the correct pharmacy for this prescription? Yes If no, delete pharmacy and type the correct one.   Has the prescription been filled recently? No  Is the patient out of the medication? Yes  Has the patient been seen for an appointment in the last year OR does the patient have an upcoming appointment? Yes  Can we respond through MyChart? Yes  Agent: Please be advised that Rx refills may take up to 3 business days. We ask that you follow-up with your pharmacy.   ----------------------------------------------------------------------- From previous Reason for Contact - Scheduling: Patient/patient representative is calling to schedule an appointment. Refer to attachments for appointment information.

## 2023-12-08 NOTE — Discharge Instructions (Addendum)
 You were seen today for concerns of coughing and wheezing At this time I am concerned that you may have bronchitis but without imaging I am unable to rule out pneumonia I am sending home with a steroid called prednisone  as well as an antibiotic called Augmentin Please take the Augmentin twice per day for 7 days.  Finish the entire course of the medication even if you are feeling better emergency room without allergic reaction or if her medical provider tells you to stop. To assist with your coughing I sent in a cough syrup called promethazine -dextromethorphan.  Please be advised that this can cause some drowsiness so only take it when you do not need to remain alert or drive. You can continue to use your albuterol  rescue inhaler as needed for coughing and shortness of breath up to every 6 hours If needed I have sent in Diflucan for you to take if you develop symptoms of yeast infection while on the antibiotic If you feel your symptoms are not improving or seem to be worsening on the medications please return here or follow-up with your primary care provider for further evaluation

## 2023-12-08 NOTE — ED Provider Notes (Signed)
 [facility]    CSN: [id number] Arrival date & time: 12/08/23  1411      History   Chief Complaint Chief Complaint  Patient presents with   Cough   Wheezing    HPI Taylor Rios is a 28 y.o. female.   HPI  Pt reports having a productive cough that has been ongoing since she had resolution of previous symptoms  She reports she was initially sick on 11/30/23 with sore throat, nasal congestion, and coughing She reports the last 3 night she has woken up with wheezing and coughing fits  She reports her SOB has become worse with activity since the coughing started   Interventions: She has been using her Albuterol  inhaler, cough drops She reports limited relief with inhaler use but this is short lived     Past Medical History:  Diagnosis Date   ADHD (attention deficit hyperactivity disorder)    Anemia    Anxiety    Hx of chlamydia infection    Hx of gonorrhea    Subclinical hypothyroidism     Patient Active Problem List   Diagnosis Date Noted   COVID 08/30/2023   Viral URI with cough 10/25/2022   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   Anemia associated with acute blood loss 04/03/2021   Cesarean delivery - repeat 04/02/2021   Previous cesarean section 04/02/2021   Normal pregnancy in multigravida 11/20/2020   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/07/2020   Genital herpes simplex 08/07/2020   Menorrhagia 08/07/2020   Postpartum care following cesarean delivery 10/21 03/10/2016    Past Surgical History:  Procedure Laterality Date   CESAREAN SECTION N/A 03/09/2016   Procedure: CESAREAN SECTION;  Surgeon: Charlie Flowers, MD;  Location: [facility] BIRTHING SU[facility];  Service: Obstetrics;  Laterality: N/A;   CESAREAN SECTION N/A 04/02/2021   Procedure: Repeat CESAREAN SECTION;  Surgeon: Flowers Charlie, MD;  Location: [facility] LD ORS;  Service: Obstetrics;  Laterality: N/A;  EDD: 04/06/21    OB History     Gravida  2   Para  2   Term  2   Preterm      AB       Living  2      SAB      IAB      Ectopic      Multiple  0   Live Births  2        Obstetric Comments  C/s ? Dilation, or descent          Home Medications    Prior to Admission medications   Medication Sig Start Date End Date Taking? Authorizing Provider  albuterol  (VENTOLIN  HFA) 108 (90 Base) MCG/ACT inhaler Inhale 2 puffs into the lungs every 6 (six) hours as needed for wheezing or shortness of breath. 08/30/23  Yes Kayla Jeoffrey RAMAN, FNP  amoxicillin -clavulanate (AUGMENTIN) 875-125 MG tablet Take 1 tablet by mouth every 12 (twelve) hours. 12/08/23  Yes Nadean Montanaro E, PA-C  fluconazole (DIFLUCAN) 150 MG tablet Take 1 tablet (150 mg total) by mouth every three (3) days as needed. May repeat in 3 days if symptoms not resolved 12/08/23  Yes Lora Chavers E, PA-C  predniSONE  (DELTASONE ) 20 MG tablet Take 2 tablets (40 mg total) by mouth daily for 5 days. 12/08/23 12/13/23 Yes Rae Plotner E, PA-C  promethazine -dextromethorphan (PROMETHAZINE -DM) 6.25-15 MG/5ML syrup Take 5 mLs by mouth 4 (four) times daily as needed for cough. 12/08/23  Yes Janifer Gieselman E, PA-C  ALPRAZolam  (  XANAX ) 0.5 MG tablet Take 1 tablet (0.5 mg total) by mouth 3 (three) times daily as needed. 09/18/23   Duanne Butler DASEN, MD  Iron , Ferrous Sulfate , 325 (65 Fe) MG TABS Take 325 mg by mouth daily. Patient not taking: Reported on 08/30/2023 03/17/23   Duanne Butler DASEN, MD  norethindrone -ethinyl estradiol (LOESTRIN 1/20, 21,) 1-20 MG-MCG tablet Take 1 tablet by mouth daily. Patient not taking: Reported on 08/30/2023 03/13/23   Duanne Butler DASEN, MD    Family History Family History  Problem Relation Age of Onset   Hypertension Mother    Cancer Father        Hodgkin's Lymphoma   Heart disease Father    Hypertension Father    Diabetes Maternal Aunt    Asthma Maternal Grandmother    Diabetes Maternal Grandmother    Miscarriages / Stillbirths Maternal Grandmother    Alcohol abuse Neg Hx    Arthritis Neg Hx     Birth defects Neg Hx    COPD Neg Hx    Depression Neg Hx    Drug abuse Neg Hx    Early death Neg Hx    Hearing loss Neg Hx    Hyperlipidemia Neg Hx    Kidney disease Neg Hx    Learning disabilities Neg Hx    Mental illness Neg Hx    Mental retardation Neg Hx    Stroke Neg Hx    Vision loss Neg Hx    Varicose Veins Neg Hx     Social History Social History   Tobacco Use   Smoking status: Never   Smokeless tobacco: Never  Vaping Use   Vaping status: Never Used  Substance Use Topics   Alcohol use: Yes    Comment: occasionally   Drug use: No     Allergies   Claritin [loratadine]   Review of Systems Review of Systems  Constitutional:  Negative for chills, diaphoresis and fever.  HENT:  Negative for congestion, ear pain, postnasal drip, rhinorrhea and sore throat.   Respiratory:  Positive for cough, shortness of breath and wheezing.      Physical Exam Triage Vital Signs ED Triage Vitals  Encounter Vitals Group     BP 12/08/23 1421 105/60     Girls Systolic BP Percentile --      Girls Diastolic BP Percentile --      Boys Systolic BP Percentile --      Boys Diastolic BP Percentile --      Pulse Rate 12/08/23 1421 100     Resp 12/08/23 1421 18     Temp 12/08/23 1421 98.6 F (37 C)     Temp Source 12/08/23 1421 Oral     SpO2 12/08/23 1421 96 %     Weight 12/08/23 1424 200 lb (90.7 kg)     Height 12/08/23 1424 5' 7 (1.702 m)     Head Circumference --      Peak Flow --      Pain Score 12/08/23 1423 0     Pain Loc --      Pain Education --      Exclude from Growth Chart --    No data found.  Updated Vital Signs BP 105/60   Pulse 100   Temp 98.6 F (37 C) (Oral)   Resp 18   Ht 5' 7 (1.702 m)   Wt 200 lb (90.7 kg)   LMP 11/04/2023 (Exact Date)   SpO2 96%   Breastfeeding No   BMI  31.32 kg/m   Visual Acuity Right Eye Distance:   Left Eye Distance:   Bilateral Distance:    Right Eye Near:   Left Eye Near:    Bilateral Near:     Physical  Exam Vitals reviewed.  Constitutional:      General: She is awake. She is not in acute distress.    Appearance: Normal appearance. She is well-developed and well-groomed. She is not ill-appearing or toxic-appearing.  HENT:     Head: Normocephalic and atraumatic.     Right Ear: Hearing, tympanic membrane and ear canal normal.     Left Ear: Hearing, tympanic membrane and ear canal normal.     Mouth/Throat:     Lips: Pink.     Mouth: Mucous membranes are moist.     Pharynx: Oropharynx is clear. Uvula midline. No pharyngeal swelling, oropharyngeal exudate, posterior oropharyngeal erythema, uvula swelling or postnasal drip.   Cardiovascular:     Rate and Rhythm: Normal rate and regular rhythm.     Pulses: Normal pulses.          Radial pulses are 2+ on the right side and 2+ on the left side.     Heart sounds: Normal heart sounds. No murmur heard.    No friction rub. No gallop.  Pulmonary:     Effort: Pulmonary effort is normal.     Breath sounds: Decreased air movement present. Wheezing present. No decreased breath sounds, rhonchi or rales.   Musculoskeletal:     Cervical back: Normal range of motion and neck supple.  Lymphadenopathy:     Head:     Right side of head: No submental, submandibular or preauricular adenopathy.     Left side of head: No submental, submandibular or preauricular adenopathy.     Cervical:     Right cervical: No superficial cervical adenopathy.    Left cervical: No superficial cervical adenopathy.     Upper Body:     Right upper body: No supraclavicular adenopathy.     Left upper body: No supraclavicular adenopathy.   Skin:    General: Skin is warm and dry.   Neurological:     General: No focal deficit present.     Mental Status: She is alert and oriented to person, place, and time.   Psychiatric:        Mood and Affect: Mood normal.        Behavior: Behavior normal. Behavior is cooperative.      UC Treatments / Results  Labs (all labs ordered  are listed, but only abnormal results are displayed) Labs Reviewed - No data to display  EKG   Radiology No results found.  Procedures Procedures (including critical care time)  Medications Ordered in UC Medications  ipratropium-albuterol  (DUONEB) 0.5-2.5 (3) MG/3ML nebulizer solution 3 mL (3 mLs Nebulization Given 12/08/23 1453)    Initial Impression / Assessment and Plan / UC Course  I have reviewed the triage vital signs and the nursing notes.  Pertinent labs & imaging results that were available during my care of the patient were reviewed by me and considered in my medical decision making (see chart for details).      Final Clinical Impressions(s) / UC Diagnoses   Final diagnoses:  Acute bronchitis, unspecified organism  Mild intermittent asthma with acute exacerbation   Patient presents with concerns for persistent coughing as well as shortness of breath and wheezing.  She reports this has been ongoing for about 2 weeks and is not improving  with home measures.  Physical exam is notable for global wheezes and rhonchi as well as mildly decreased air movement.  DuoNeb administration in clinic did not provide significant improvement in these findings.  X-ray imaging is not available in clinic today and I am concerned for potential pneumonia given physical exam findings but oxygen saturation is in normal range and respiratory rate is not increased.  Will send patient home with Augmentin p.o. twice daily x 7 days, prednisone  40 mg p.o. daily x 5-day burst, promethazine -dextromethorphan cough syrup.  Patient reports that she does get yeast infections with antibiotics so we will send Diflucan as well.  Recommend continued use of albuterol  inhaler as needed for coughing spells and acute shortness of breath.  ED and return precautions reviewed and provided in after visit summary.  Follow-up as needed    Discharge Instructions      You were seen today for concerns of coughing and  wheezing At this time I am concerned that you may have bronchitis but without imaging I am unable to rule out pneumonia I am sending home with a steroid called prednisone  as well as an antibiotic called Augmentin Please take the Augmentin twice per day for 7 days.  Finish the entire course of the medication even if you are feeling better emergency room without allergic reaction or if her medical provider tells you to stop. To assist with your coughing I sent in a cough syrup called promethazine -dextromethorphan.  Please be advised that this can cause some drowsiness so only take it when you do not need to remain alert or drive. You can continue to use your albuterol  rescue inhaler as needed for coughing and shortness of breath up to every 6 hours If needed I have sent in Diflucan for you to take if you develop symptoms of yeast infection while on the antibiotic If you feel your symptoms are not improving or seem to be worsening on the medications please return here or follow-up with your primary care provider for further evaluation      ED Prescriptions     Medication Sig Dispense Auth. Provider   predniSONE  (DELTASONE ) 20 MG tablet Take 2 tablets (40 mg total) by mouth daily for 5 days. 10 tablet Leonie Amacher E, PA-C   amoxicillin -clavulanate (AUGMENTIN) 875-125 MG tablet Take 1 tablet by mouth every 12 (twelve) hours. 14 tablet Verley Pariseau E, PA-C   fluconazole (DIFLUCAN) 150 MG tablet Take 1 tablet (150 mg total) by mouth every three (3) days as needed. May repeat in 3 days if symptoms not resolved 2 tablet Lunette Tapp E, PA-C   promethazine -dextromethorphan (PROMETHAZINE -DM) 6.25-15 MG/5ML syrup Take 5 mLs by mouth 4 (four) times daily as needed for cough. 118 mL Corbyn Steedman E, PA-C      PDMP not reviewed this encounter.   Marylene Rocky BRAVO, PA-C 12/08/23 1636

## 2023-12-08 NOTE — ED Triage Notes (Addendum)
 Pt presents with complaints of cough and wheezing x 4 days. States she is using her albuterol  inhaler at home with no noticeable relief. Pt currently denies pain. Denies taking OTC medications at home. Pt states she is slightly short of breath. Reports she is coughing up green mucus. 96% on room air. Has been ill since 6/19 (different symptoms than now).

## 2023-12-11 NOTE — Telephone Encounter (Signed)
 Requested medication (s) are due for refill today: yes  Requested medication (s) are on the active medication list: yes  Last refill:  09/18/23 #30  Future visit scheduled: no  Notes to clinic:  med not delegated to NT to RF   Requested Prescriptions  Pending Prescriptions Disp Refills   ALPRAZolam  (XANAX ) 0.5 MG tablet 30 tablet 0    Sig: Take 1 tablet (0.5 mg total) by mouth 3 (three) times daily as needed.     Not Delegated - Psychiatry: Anxiolytics/Hypnotics 2 Failed - 12/11/2023 12:25 PM      Failed - This refill cannot be delegated      Failed - Urine Drug Screen completed in last 360 days      Failed - Valid encounter within last 6 months    Recent Outpatient Visits           3 months ago COVID   [place] [facility] Kayla Jeoffrey RAMAN, FNP   9 months ago DUB (dysfunctional uterine bleeding)   [place] [facility] Duanne, Butler DASEN, MD   1 year ago Viral URI with cough   [place] [facility] Kayla Jeoffrey RAMAN, FNP   1 year ago RAD (reactive airway disease) with wheezing, mild intermittent, uncomplicated   [place] [facility] Pickard, Butler DASEN, MD   1 year ago Sore throat   [place] [facility] Pickard, Butler DASEN, MD              Passed - Patient is not pregnant

## 2023-12-12 MED ORDER — ALPRAZOLAM 0.5 MG PO TABS
0.5000 mg | ORAL_TABLET | Freq: Three times a day (TID) | ORAL | 0 refills | Status: DC | PRN
Start: 2023-12-12 — End: 2024-04-02

## 2023-12-14 ENCOUNTER — Telehealth: Payer: Self-pay | Admitting: Family Medicine

## 2023-12-14 ENCOUNTER — Telehealth: Payer: Self-pay

## 2023-12-14 ENCOUNTER — Encounter: Payer: Self-pay | Admitting: Family Medicine

## 2023-12-14 ENCOUNTER — Other Ambulatory Visit: Payer: Self-pay | Admitting: Family Medicine

## 2023-12-14 NOTE — Telephone Encounter (Signed)
Duplicate message/addressed in separate encounter.

## 2023-12-14 NOTE — Telephone Encounter (Signed)
 Copied from CRM [id number]. Topic: Clinical - Prescription Issue >> Dec 14, 2023 12:01 PM Sasha H wrote: Reason for CRM: pt states her pharmacy told her that ALPRAZolam  (XANAX ) 0.5 MG tablet needs his approval

## 2023-12-14 NOTE — Telephone Encounter (Signed)
 Copied from CRM [id number]. Topic: Clinical - Medication Refill >> Dec 08, 2023 11:27 AM Montie POUR wrote: Medication: ALPRAZolam  (XANAX ) 0.5 MG tablet  Has the patient contacted their pharmacy? Yes (Agent: If no, request that the patient contact the pharmacy for the refill. If patient does not wish to contact the pharmacy document the reason why and proceed with request.) (Agent: If yes, when and what did the pharmacy advise?) Pharmacy needs order to refill  This is the patient's preferred pharmacy:  [id number] - [place], [place] - [address] AT [facility] [address] [place] [address] Phone: [phone] Fax: [phone]  Is this the correct pharmacy for this prescription? Yes If no, delete pharmacy and type the correct one.   Has the prescription been filled recently? No  Is the patient out of the medication? Yes  Has the patient been seen for an appointment in the last year OR does the patient have an upcoming appointment? Yes  Can we respond through MyChart? Yes  Agent: Please be advised that Rx refills may take up to 3 business days. We ask that you follow-up with your pharmacy.   ----------------------------------------------------------------------- From previous Reason for Contact - Scheduling: Patient/patient representative is calling to schedule an appointment. Refer to attachments for appointment information.

## 2024-04-02 ENCOUNTER — Other Ambulatory Visit: Payer: Self-pay | Admitting: Family Medicine

## 2024-04-02 DIAGNOSIS — J029 Acute pharyngitis, unspecified: Secondary | ICD-10-CM

## 2024-04-02 NOTE — Telephone Encounter (Unsigned)
 Copied from CRM [id number]. Topic: Clinical - Medication Refill >> Apr 02, 2024  2:13 PM Zebedee SAUNDERS wrote: Medication: ALPRAZolam  (XANAX ) 0.5 MG tablet  Has the patient contacted their pharmacy? Yes (Agent: If no, request that the patient contact the pharmacy for the refill. If patient does not wish to contact the pharmacy document the reason why and proceed with request.) (Agent: If yes, when and what did the pharmacy advise?)  This is the patient's preferred pharmacy:  [id number] - [place], [place] - [address] AT [facility] [address] [place] [address] Phone: [phone] Fax: [phone]  Is this the correct pharmacy for this prescription? Yes If no, delete pharmacy and type the correct one.   Has the prescription been filled recently? Yes  Is the patient out of the medication? Yes  Has the patient been seen for an appointment in the last year OR does the patient have an upcoming appointment? Yes  Can we respond through MyChart? Yes  Agent: Please be advised that Rx refills may take up to 3 business days. We ask that you follow-up with your pharmacy.

## 2024-04-04 ENCOUNTER — Encounter: Payer: Self-pay | Admitting: Family Medicine

## 2024-04-04 ENCOUNTER — Ambulatory Visit: Admitting: Family Medicine

## 2024-04-04 VITALS — BP 122/68 | HR 88 | Temp 98.5°F | Ht 67.0 in | Wt 219.4 lb

## 2024-04-04 DIAGNOSIS — R1013 Epigastric pain: Secondary | ICD-10-CM | POA: Diagnosis not present

## 2024-04-04 MED ORDER — PANTOPRAZOLE SODIUM 40 MG PO TBEC: 40.0000 mg | DELAYED_RELEASE_TABLET | Freq: Every day | ORAL | 3 refills | Status: AC

## 2024-04-04 MED ORDER — ALPRAZOLAM 0.5 MG PO TABS: 0.5000 mg | ORAL_TABLET | Freq: Three times a day (TID) | ORAL | 0 refills | Status: AC | PRN

## 2024-04-04 NOTE — Telephone Encounter (Signed)
 Requested medication (s) are due for refill today: Yes  Requested medication (s) are on the active medication list:Yes  Last refill:  12/12/23  Future visit scheduled: Yes  Notes to clinic:  Not delegated.    Requested Prescriptions  Pending Prescriptions Disp Refills   ALPRAZolam  (XANAX ) 0.5 MG tablet 30 tablet 0    Sig: Take 1 tablet (0.5 mg total) by mouth 3 (three) times daily as needed.     Not Delegated - Psychiatry: Anxiolytics/Hypnotics 2 Failed - 04/04/2024  1:06 PM      Failed - This refill cannot be delegated      Failed - Urine Drug Screen completed in last 360 days      Failed - Valid encounter within last 6 months    Recent Outpatient Visits           7 months ago COVID   [place] [facility] Kayla Jeoffrey RAMAN, FNP   1 year ago DUB (dysfunctional uterine bleeding)   [place] Duanne, Butler DASEN, MD   1 year ago Viral URI with cough   [place] [facility] Kayla Jeoffrey RAMAN, FNP   1 year ago RAD (reactive airway disease) with wheezing, mild intermittent, uncomplicated   [place] [facility] Pickard, Butler DASEN, MD   1 year ago Sore throat   [place] [facility] Pickard, Butler DASEN, MD              Passed - Patient is not pregnant

## 2024-04-04 NOTE — Progress Notes (Signed)
 Subjective:    Patient ID: Taylor Rios, female    DOB: 06/26/1995, 28 y.o.   MRN: [id number]  Diarrhea     Patient believes she needs a referral to gastroenterology.  She states for the last several months, she wakes up every morning extremely nauseated.  She has intense epigastric pain and discomfort.  If she does not eat something quickly she will vomit.  Throughout the rest of the day the pain and nausea improved however it is present every morning.  Also every morning, she will have to sit on the toilet for almost an hour.  She is having loose stools however she is having intense abdominal pain and cramps as soon as she wakes up.  This also improves throughout the day.  There is no specific trigger.  However first thing in the morning seems to be alleviating his issue.  She is under stress but she is not going to work in the morning.  There is no trigger in the morning that would seem to exacerbate this.  She denies any fevers or chills.  She denies any melena.  She denies any hematochezia.  She denies any weight loss.  Patient is using marijuana but denies drinking any significant alcohol.  She will occasionally have a drink. Past Medical History:  Diagnosis Date   ADHD (attention deficit hyperactivity disorder)    Anemia    Anxiety    Hx of chlamydia infection    Hx of gonorrhea    Subclinical hypothyroidism    Past Surgical History:  Procedure Laterality Date   CESAREAN SECTION N/A 03/09/2016   Procedure: CESAREAN SECTION;  Surgeon: Charlie Flowers, MD;  Location: [facility] BIR[facility];  Service: Obstetrics;  Laterality: N/A;   CESAREAN SECTION N/A 04/02/2021   Procedure: Repeat CESAREAN SECTION;  Surgeon: Flowers Charlie, MD;  Location: [facility] LD ORS;  Service: Obstetrics;  Laterality: N/A;  EDD: 04/06/21   Current Outpatient Medications on File Prior to Visit  Medication Sig Dispense Refill   ALPRAZolam  (XANAX ) 0.5 MG tablet Take 1 tablet (0.5 mg total) by mouth 3 (three) times  daily as needed. 30 tablet 0   albuterol  (VENTOLIN  HFA) 108 (90 Base) MCG/ACT inhaler INHALE 2 PUFFS INTO THE LUNGS UP TO EVERY 6HRS AS NEEDED FOR WHEEZING/SHORTNESS OF BREATH (Patient not taking: Reported on 04/04/2024) 18 each 1   amoxicillin -clavulanate (AUGMENTIN ) 875-125 MG tablet Take 1 tablet by mouth every 12 (twelve) hours. (Patient not taking: Reported on 04/04/2024) 14 tablet 0   fluconazole  (DIFLUCAN ) 150 MG tablet Take 1 tablet (150 mg total) by mouth every three (3) days as needed. May repeat in 3 days if symptoms not resolved (Patient not taking: Reported on 04/04/2024) 2 tablet 0   Iron , Ferrous Sulfate , 325 (65 Fe) MG TABS Take 325 mg by mouth daily. (Patient not taking: Reported on 04/04/2024) 30 tablet 4   norethindrone -ethinyl estradiol (LOESTRIN 1/20, 21,) 1-20 MG-MCG tablet Take 1 tablet by mouth daily. (Patient not taking: Reported on 04/04/2024) 28 tablet 11   promethazine -dextromethorphan (PROMETHAZINE -DM) 6.25-15 MG/5ML syrup Take 5 mLs by mouth 4 (four) times daily as needed for cough. (Patient not taking: Reported on 04/04/2024) 118 mL 0   No current facility-administered medications on file prior to visit.   Allergies  Allergen Reactions   Claritin [Loratadine] Shortness Of Breath, Nausea Only and Swelling   Social History   Socioeconomic History   Marital status: Single    Spouse name: Not on file   Number of children:  Not on file   Years of education: Not on file   Highest education level: Not on file  Occupational History   Not on file  Tobacco Use   Smoking status: Never   Smokeless tobacco: Never  Vaping Use   Vaping status: Never Used  Substance and Sexual Activity   Alcohol use: Yes    Comment: occasionally   Drug use: No   Sexual activity: Yes    Birth control/protection: Condom  Other Topics Concern   Not on file  Social History Narrative   Not on file   Social Drivers of Health   Financial Resource Strain: Not on file  Food Insecurity:  Not on file  Transportation Needs: Not on file  Physical Activity: Not on file  Stress: Not on file  Social Connections: Not on file  Intimate Partner Violence: Not on file     Review of Systems  Gastrointestinal:  Positive for diarrhea.  All other systems reviewed and are negative.      Objective:   Physical Exam Vitals reviewed.  Constitutional:      General: She is not in acute distress.    Appearance: Normal appearance. She is normal weight. She is not ill-appearing or toxic-appearing.  Cardiovascular:     Rate and Rhythm: Normal rate and regular rhythm.  Pulmonary:     Effort: Pulmonary effort is normal.     Breath sounds: Normal breath sounds.  Neurological:     General: No focal deficit present.     Mental Status: She is alert and oriented to person, place, and time.     Cranial Nerves: No cranial nerve deficit.     Motor: No weakness.     Coordination: Coordination normal.  Psychiatric:        Mood and Affect: Mood normal.        Behavior: Behavior normal.        Thought Content: Thought content normal.        Judgment: Judgment normal.        Assessment & Plan:  Epigastric pain - Plan: Celiac panel 10, CBC with Differential/Platelet, Comprehensive metabolic panel with GFR, Lipase, Celiac Disease Panel Epigastric pain almost sounds like gastritis.  Recommended trying pantoprazole 40 mg a day for possible gastritis.  Check the patient for celiac disease.  Check also CBC CMP and lipase given the epigastric pain in the morning.  The watery stool in the morning and the intense abdominal cramps sound like intestinal spasms.  I do believe this is likely IBS.  If labs were normal and pantoprazole does not help, I would consider trying the patient on Xifaxan.  In the meantime I recommended that the patient add a fiber supplement to try to help reduce some of the watery stools in the morning.

## 2024-04-05 ENCOUNTER — Other Ambulatory Visit: Payer: Self-pay | Admitting: Family Medicine

## 2024-04-05 ENCOUNTER — Ambulatory Visit: Payer: Self-pay | Admitting: Family Medicine

## 2024-04-05 DIAGNOSIS — K859 Acute pancreatitis without necrosis or infection, unspecified: Secondary | ICD-10-CM

## 2024-04-05 DIAGNOSIS — K861 Other chronic pancreatitis: Secondary | ICD-10-CM

## 2024-04-05 LAB — CELIAC DISEASE PANEL
(tTG) Ab, IgA: <1 U/mL
(tTG) Ab, IgG: <1 U/mL
Gliadin IgA: <1 U/mL
Gliadin IgG: <1 U/mL
Immunoglobulin A: 243 mg/dL (ref 47–310)

## 2024-04-05 LAB — CBC WITH DIFFERENTIAL/PLATELET
Absolute Lymphocytes: 2138 {cells}/uL (ref 850–3900)
Absolute Monocytes: 677 {cells}/uL (ref 200–950)
Basophils Absolute: 43 {cells}/uL (ref 0–200)
Basophils Relative: 0.6 %
Eosinophils Absolute: 338 {cells}/uL (ref 15–500)
Eosinophils Relative: 4.7 %
HCT: 35.2 % (ref 35.0–45.0)
Hemoglobin: 10.7 g/dL — ABNORMAL LOW (ref 11.7–15.5)
MCH: 23.3 pg — ABNORMAL LOW (ref 27.0–33.0)
MCHC: 30.4 g/dL — ABNORMAL LOW (ref 32.0–36.0)
MCV: 76.5 fL — ABNORMAL LOW (ref 80.0–100.0)
MPV: 12.8 fL — ABNORMAL HIGH (ref 7.5–12.5)
Monocytes Relative: 9.4 %
Neutro Abs: 4003 {cells}/uL (ref 1500–7800)
Neutrophils Relative %: 55.6 %
Platelets: 239 Thousand/uL (ref 140–400)
RBC: 4.6 Million/uL (ref 3.80–5.10)
RDW: 15.6 % — ABNORMAL HIGH (ref 11.0–15.0)
Total Lymphocyte: 29.7 %
WBC: 7.2 Thousand/uL (ref 3.8–10.8)

## 2024-04-05 LAB — COMPREHENSIVE METABOLIC PANEL WITH GFR
AG Ratio: 1.6 (calc) (ref 1.0–2.5)
ALT: 13 U/L (ref 6–29)
AST: 17 U/L (ref 10–30)
Albumin: 4.7 g/dL (ref 3.6–5.1)
Alkaline phosphatase (APISO): 44 U/L (ref 31–125)
BUN: 12 mg/dL (ref 7–25)
CO2: 28 mmol/L (ref 20–32)
Calcium: 9.6 mg/dL (ref 8.6–10.2)
Chloride: 106 mmol/L (ref 98–110)
Creat: 0.64 mg/dL (ref 0.50–0.96)
Globulin: 2.9 g/dL (ref 1.9–3.7)
Glucose, Bld: 88 mg/dL (ref 65–99)
Potassium: 4.6 mmol/L (ref 3.5–5.3)
Sodium: 139 mmol/L (ref 135–146)
Total Bilirubin: 0.2 mg/dL (ref 0.2–1.2)
Total Protein: 7.6 g/dL (ref 6.1–8.1)
eGFR: 124 mL/min/1.73m2 (ref >=60)

## 2024-04-05 LAB — LIPASE: Lipase: 63 U/L — ABNORMAL HIGH (ref 7–60)

## 2024-04-08 ENCOUNTER — Encounter: Payer: Self-pay | Admitting: Family Medicine

## 2024-04-10 ENCOUNTER — Inpatient Hospital Stay: Admission: RE | Admit: 2024-04-10 | Discharge: 2024-04-10 | Attending: Family Medicine | Admitting: Family Medicine

## 2024-04-10 DIAGNOSIS — K859 Acute pancreatitis without necrosis or infection, unspecified: Secondary | ICD-10-CM

## 2024-04-10 MED ORDER — IOPAMIDOL (ISOVUE-300) INJECTION 61%
100.0000 mL | Freq: Once | INTRAVENOUS | Status: AC | PRN
  Administered 2024-04-10: 100 mL via INTRAVENOUS

## 2024-04-12 ENCOUNTER — Other Ambulatory Visit: Payer: Self-pay | Admitting: Family Medicine

## 2024-04-12 ENCOUNTER — Ambulatory Visit: Payer: Self-pay | Admitting: Family Medicine

## 2024-04-12 ENCOUNTER — Other Ambulatory Visit: Payer: Self-pay

## 2024-04-12 DIAGNOSIS — D270 Benign neoplasm of right ovary: Secondary | ICD-10-CM

## 2024-04-12 DIAGNOSIS — N838 Other noninflammatory disorders of ovary, fallopian tube and broad ligament: Secondary | ICD-10-CM
